# Patient Record
Sex: Female | Born: 1935
Health system: Southern US, Community
[De-identification: ages and names within clinical notes are randomized; demographics above are authoritative.]

## PROBLEM LIST (undated history)

## (undated) DIAGNOSIS — D649 Anemia, unspecified: Secondary | ICD-10-CM

---

## 2004-05-23 ENCOUNTER — Ambulatory Visit: Payer: Self-pay | Admitting: Internal Medicine

## 2005-06-25 ENCOUNTER — Ambulatory Visit: Payer: Self-pay | Admitting: Internal Medicine

## 2006-06-28 ENCOUNTER — Ambulatory Visit: Payer: Self-pay | Admitting: Internal Medicine

## 2006-07-02 ENCOUNTER — Ambulatory Visit: Payer: Self-pay | Admitting: Internal Medicine

## 2007-02-03 ENCOUNTER — Emergency Department: Payer: Self-pay | Admitting: Emergency Medicine

## 2007-06-13 ENCOUNTER — Ambulatory Visit: Payer: Self-pay | Admitting: Internal Medicine

## 2007-06-27 ENCOUNTER — Ambulatory Visit: Payer: Self-pay | Admitting: Internal Medicine

## 2007-07-08 ENCOUNTER — Ambulatory Visit: Payer: Self-pay | Admitting: Internal Medicine

## 2007-07-14 ENCOUNTER — Ambulatory Visit: Payer: Self-pay | Admitting: Internal Medicine

## 2007-10-03 ENCOUNTER — Ambulatory Visit: Payer: Self-pay | Admitting: Internal Medicine

## 2007-10-14 ENCOUNTER — Ambulatory Visit: Payer: Self-pay | Admitting: Internal Medicine

## 2007-11-13 ENCOUNTER — Ambulatory Visit: Payer: Self-pay | Admitting: Internal Medicine

## 2007-11-14 ENCOUNTER — Ambulatory Visit: Payer: Self-pay | Admitting: Internal Medicine

## 2007-12-14 ENCOUNTER — Ambulatory Visit: Payer: Self-pay | Admitting: Internal Medicine

## 2008-01-13 ENCOUNTER — Ambulatory Visit: Payer: Self-pay | Admitting: Internal Medicine

## 2008-01-16 ENCOUNTER — Ambulatory Visit: Payer: Self-pay | Admitting: Internal Medicine

## 2008-02-13 ENCOUNTER — Ambulatory Visit: Payer: Self-pay | Admitting: Internal Medicine

## 2008-07-13 ENCOUNTER — Ambulatory Visit: Payer: Self-pay | Admitting: Internal Medicine

## 2009-07-14 ENCOUNTER — Ambulatory Visit: Payer: Self-pay | Admitting: Internal Medicine

## 2010-12-06 ENCOUNTER — Ambulatory Visit: Payer: Self-pay | Admitting: Internal Medicine

## 2011-08-22 ENCOUNTER — Ambulatory Visit: Payer: Self-pay | Admitting: Physician Assistant

## 2011-09-27 ENCOUNTER — Ambulatory Visit: Payer: Self-pay | Admitting: Specialist

## 2011-11-22 ENCOUNTER — Ambulatory Visit: Payer: Self-pay | Admitting: Specialist

## 2011-11-29 ENCOUNTER — Ambulatory Visit: Payer: Self-pay | Admitting: Cardiothoracic Surgery

## 2011-12-06 ENCOUNTER — Ambulatory Visit: Payer: Self-pay | Admitting: Cardiothoracic Surgery

## 2011-12-11 ENCOUNTER — Ambulatory Visit: Payer: Self-pay | Admitting: Internal Medicine

## 2012-02-13 ENCOUNTER — Ambulatory Visit: Payer: Self-pay | Admitting: Cardiothoracic Surgery

## 2012-02-28 ENCOUNTER — Ambulatory Visit: Payer: Self-pay | Admitting: Cardiothoracic Surgery

## 2013-01-21 ENCOUNTER — Ambulatory Visit: Payer: Self-pay | Admitting: Internal Medicine

## 2013-04-28 ENCOUNTER — Ambulatory Visit: Payer: Self-pay | Admitting: Specialist

## 2014-02-23 DIAGNOSIS — I4891 Unspecified atrial fibrillation: Secondary | ICD-10-CM | POA: Diagnosis not present

## 2014-02-23 DIAGNOSIS — I059 Rheumatic mitral valve disease, unspecified: Secondary | ICD-10-CM | POA: Diagnosis not present

## 2014-02-23 DIAGNOSIS — D510 Vitamin B12 deficiency anemia due to intrinsic factor deficiency: Secondary | ICD-10-CM | POA: Diagnosis not present

## 2014-03-26 DIAGNOSIS — D510 Vitamin B12 deficiency anemia due to intrinsic factor deficiency: Secondary | ICD-10-CM | POA: Diagnosis not present

## 2014-03-26 DIAGNOSIS — I4891 Unspecified atrial fibrillation: Secondary | ICD-10-CM | POA: Diagnosis not present

## 2014-03-26 DIAGNOSIS — E538 Deficiency of other specified B group vitamins: Secondary | ICD-10-CM | POA: Diagnosis not present

## 2014-03-26 DIAGNOSIS — I059 Rheumatic mitral valve disease, unspecified: Secondary | ICD-10-CM | POA: Diagnosis not present

## 2014-04-28 DIAGNOSIS — E538 Deficiency of other specified B group vitamins: Secondary | ICD-10-CM | POA: Diagnosis not present

## 2014-04-28 DIAGNOSIS — D510 Vitamin B12 deficiency anemia due to intrinsic factor deficiency: Secondary | ICD-10-CM | POA: Diagnosis not present

## 2014-05-18 DIAGNOSIS — R05 Cough: Secondary | ICD-10-CM | POA: Diagnosis not present

## 2014-05-18 DIAGNOSIS — J449 Chronic obstructive pulmonary disease, unspecified: Secondary | ICD-10-CM | POA: Diagnosis not present

## 2014-05-18 DIAGNOSIS — R911 Solitary pulmonary nodule: Secondary | ICD-10-CM | POA: Diagnosis not present

## 2014-05-18 DIAGNOSIS — R0602 Shortness of breath: Secondary | ICD-10-CM | POA: Diagnosis not present

## 2014-06-01 DIAGNOSIS — I25719 Atherosclerosis of autologous vein coronary artery bypass graft(s) with unspecified angina pectoris: Secondary | ICD-10-CM | POA: Diagnosis not present

## 2014-06-01 DIAGNOSIS — D510 Vitamin B12 deficiency anemia due to intrinsic factor deficiency: Secondary | ICD-10-CM | POA: Diagnosis not present

## 2014-06-01 DIAGNOSIS — I420 Dilated cardiomyopathy: Secondary | ICD-10-CM | POA: Diagnosis not present

## 2014-06-01 DIAGNOSIS — I480 Paroxysmal atrial fibrillation: Secondary | ICD-10-CM | POA: Diagnosis not present

## 2014-06-01 DIAGNOSIS — E039 Hypothyroidism, unspecified: Secondary | ICD-10-CM | POA: Diagnosis not present

## 2014-06-02 ENCOUNTER — Ambulatory Visit
Admit: 2014-06-02 | Disposition: A | Discharge status: Home / Self Care | Payer: Self-pay | Attending: Internal Medicine | Admitting: Internal Medicine

## 2014-06-02 DIAGNOSIS — D649 Anemia, unspecified: Secondary | ICD-10-CM | POA: Diagnosis not present

## 2014-06-07 DIAGNOSIS — I4891 Unspecified atrial fibrillation: Secondary | ICD-10-CM | POA: Diagnosis not present

## 2014-06-07 DIAGNOSIS — I059 Rheumatic mitral valve disease, unspecified: Secondary | ICD-10-CM | POA: Diagnosis not present

## 2014-06-16 DIAGNOSIS — D649 Anemia, unspecified: Secondary | ICD-10-CM | POA: Diagnosis not present

## 2014-07-06 DIAGNOSIS — I480 Paroxysmal atrial fibrillation: Secondary | ICD-10-CM | POA: Diagnosis not present

## 2014-07-06 DIAGNOSIS — E538 Deficiency of other specified B group vitamins: Secondary | ICD-10-CM | POA: Diagnosis not present

## 2014-07-06 DIAGNOSIS — D510 Vitamin B12 deficiency anemia due to intrinsic factor deficiency: Secondary | ICD-10-CM | POA: Diagnosis not present

## 2014-08-06 DIAGNOSIS — I480 Paroxysmal atrial fibrillation: Secondary | ICD-10-CM | POA: Diagnosis not present

## 2014-08-06 DIAGNOSIS — E538 Deficiency of other specified B group vitamins: Secondary | ICD-10-CM | POA: Diagnosis not present

## 2014-09-01 IMAGING — CT CT CHEST W/O CM
2 of 4 series · 15 of 36 positions shown, 18 images · non-contrast
Comparison: CT CHEST W/O CM dated 02/28/2012 report of a plain film
03/06/2013.

CLINICAL DATA: Left mid lung density seen on chest radiograph.
History of CABG.

EXAM:
CT CHEST WITHOUT CONTRAST
TECHNIQUE: Multidetector CT imaging of the chest was performed following the
standard protocol without IV contrast.

[Series 2: routine chest wo · axial · 0.63mm/px · z∈[-693,-418]mm · 12 of 67 slices shown, 15 images]
[im 6/67  mediastinal]
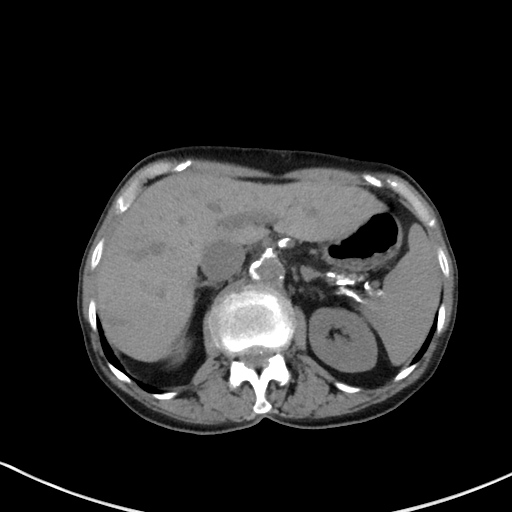
[im 6/67  lung]
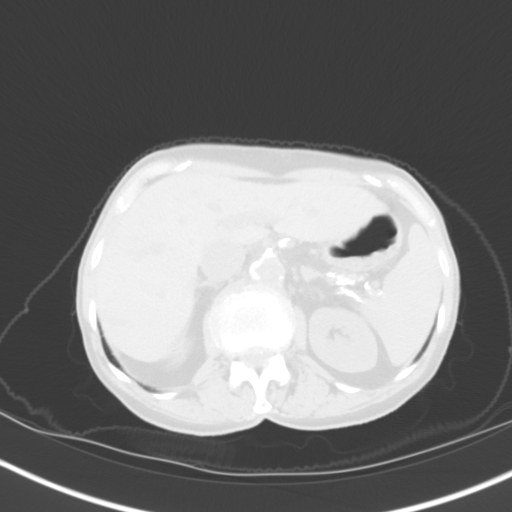
[im 11/67  lung]
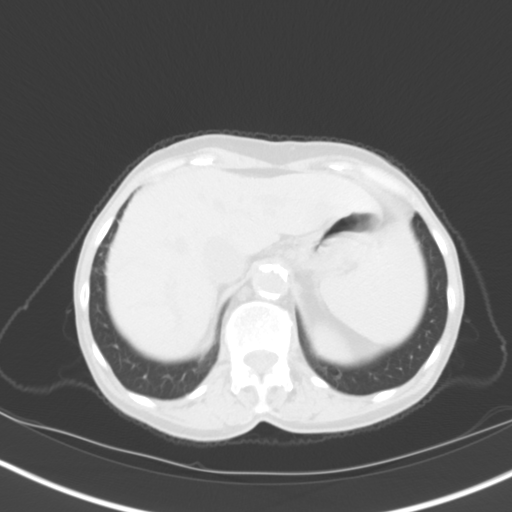
[im 16/67  lung]
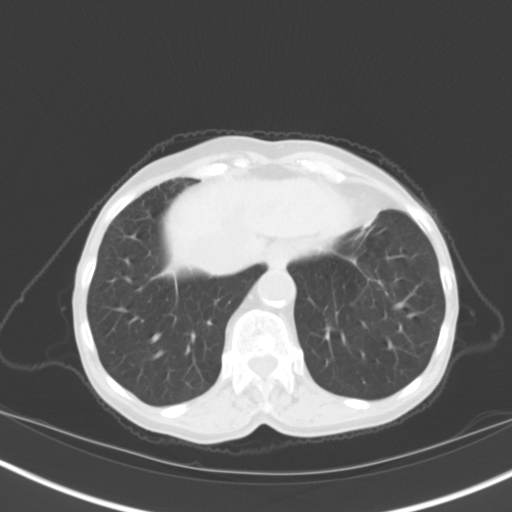
[im 21/67  lung]
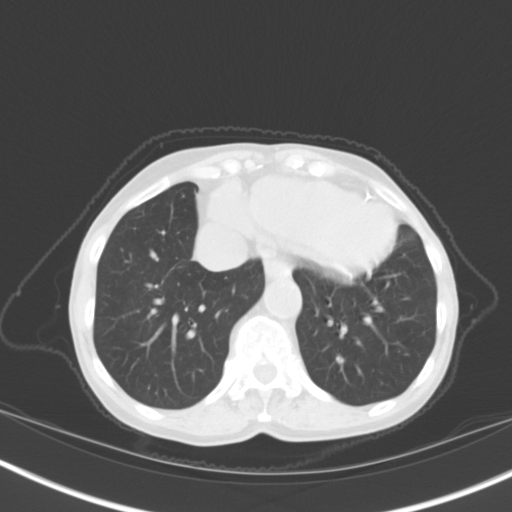
[im 26/67  mediastinal]
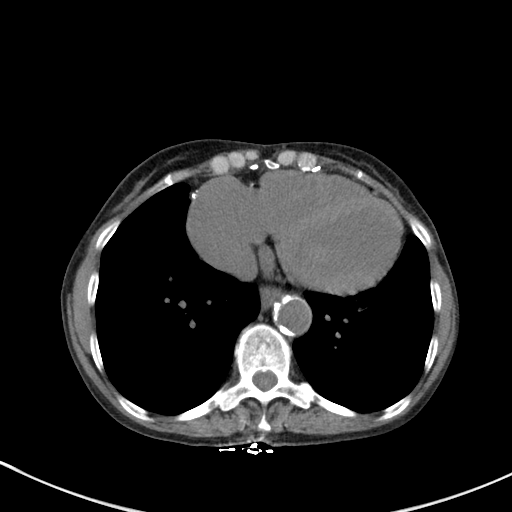
[im 26/67  lung]
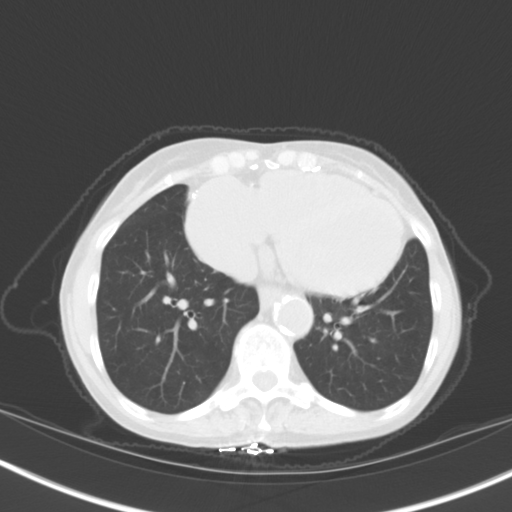
[im 31/67  lung]
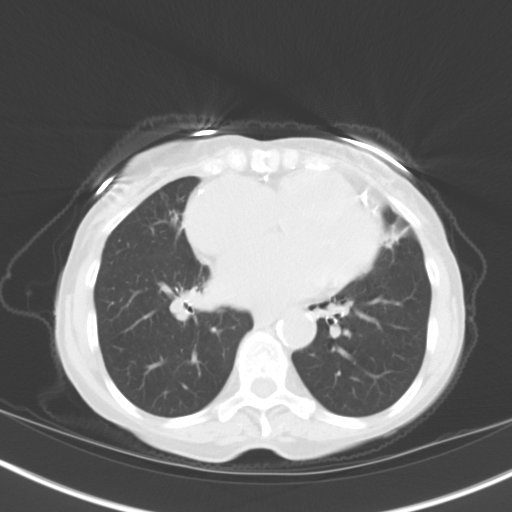
[im 36/67  lung]
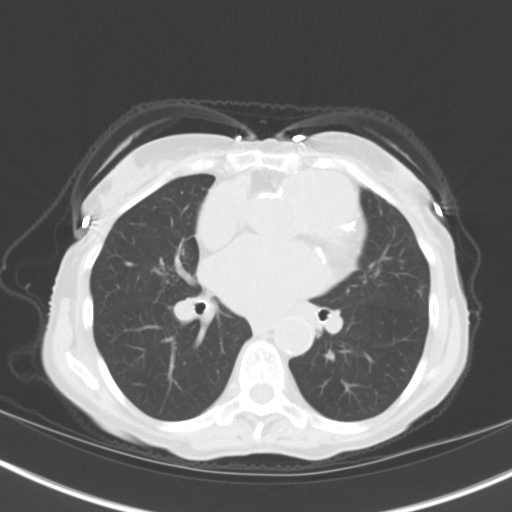
[im 41/67  lung]
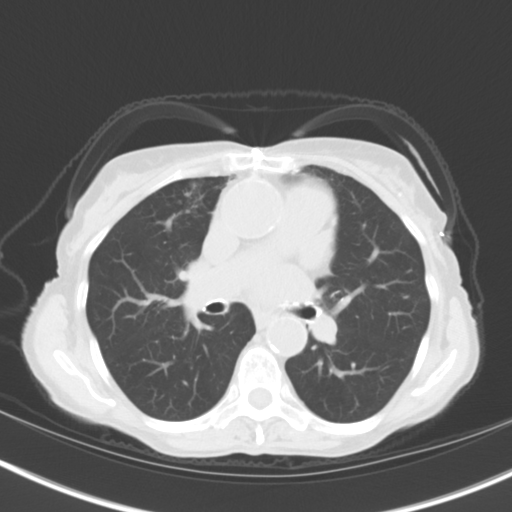
[im 46/67  mediastinal]
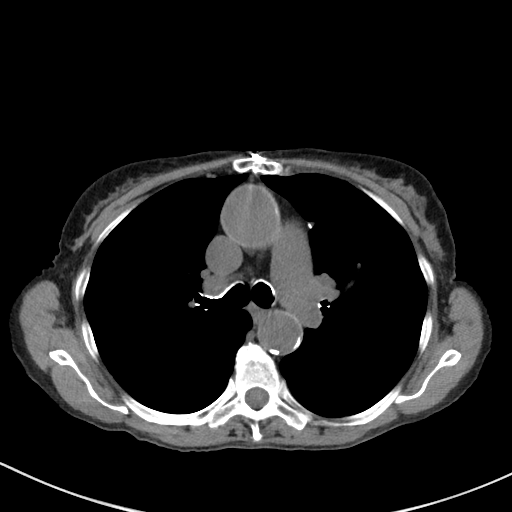
[im 46/67  lung]
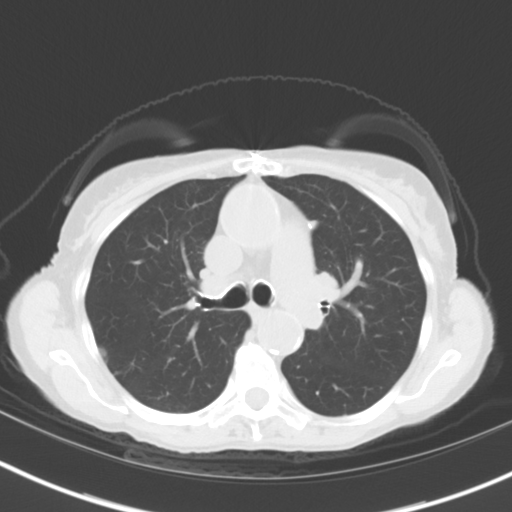
[im 51/67  lung]
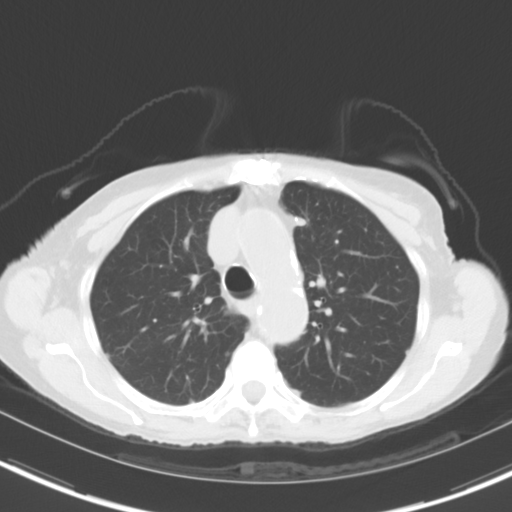
[im 56/67  lung]
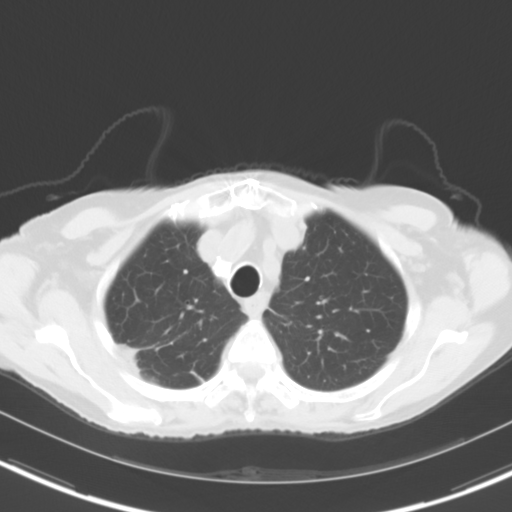
[im 61/67  lung]
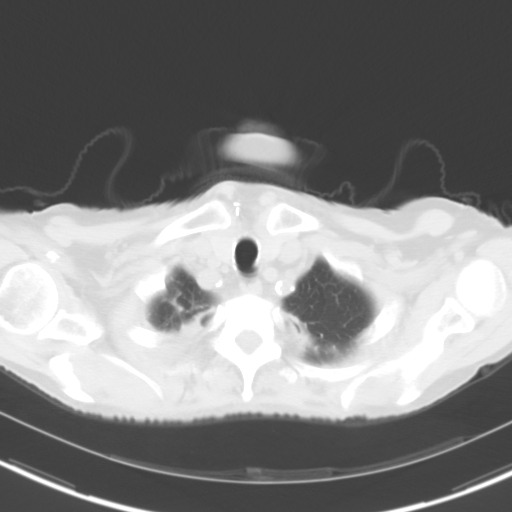

[Series 5: cor routine chest wo · coronal · 0.65mm/px · 3 of 104 slices shown]
[im 21/104  lung]
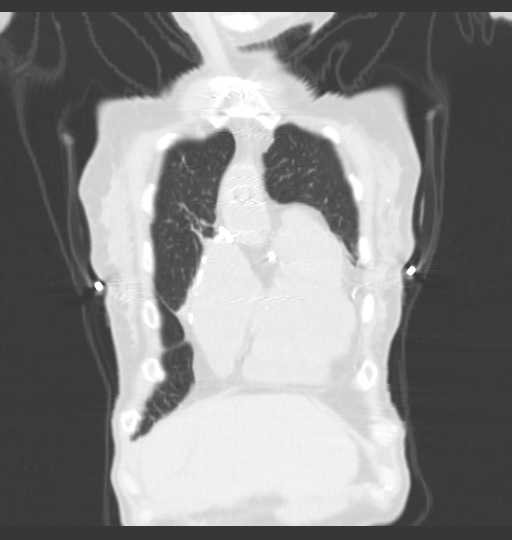
[im 42/104  lung]
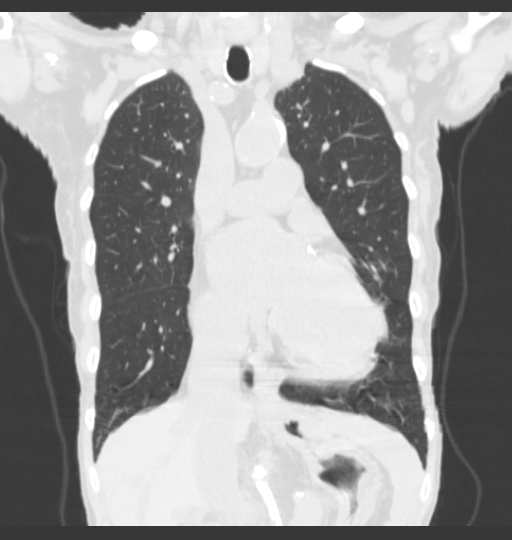
[im 62/104  lung]
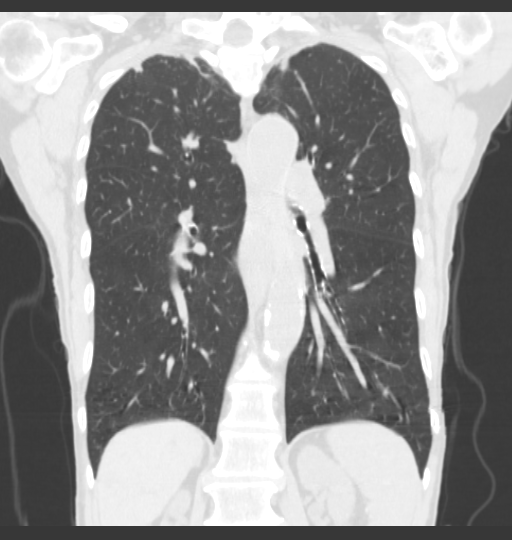

[15 of 36 positions shown; findings below may reference images not displayed]

FINDINGS: Lungs/Pleura:  Biapical pleural-parenchymal scarring at the apices.

Minimal reticular nodular opacity in the anteromedial right upper
lobe on image 20 is unchanged and likely due scarring. Similar
findings more anteriorly, within the superior aspect of the right
middle lobe. This is minimally improved since 02/28/2012. Example
image 27 today.

Subpleural increased density along the right minor fissure on image
22 is unchanged and likely due to scarring.

Scarring and volume loss in the lingula. This could have accounted
for the plain film abnormality. Example image 37. Similar. Minimal
motion degradation inferiorly. No suspicious left-sided pulmonary
nodule or mass

No pleural fluid.

Heart/Mediastinum: A left axillary node which measures 1.0 cm on
image 14 and is similar, suggesting reactive etiology. Median
sternotomy with advanced aortic atherosclerosis. Moderate
cardiomegaly with moderate bilateral atrial enlargement. No
pericardial effusion. No mediastinal or definite hilar adenopathy,
given limitations of unenhanced CT.

Relative hypoattenuation of the intravascular space, possibly
representing anemia. Example image 42.

Upper Abdomen: No significant findings. There is dense aortic and
branch vessel atherosclerosis.

Bones/Musculoskeletal:  No acute osseous abnormality.
IMPRESSION: 1. No suspicious pulmonary lesion, including within the left lung.
Scattered areas of scarring, including lingular scarring and volume
loss. This may have accounted for the plain film abnormality. Only
the prior report is available.
2. Cardiomegaly with prior median sternotomy.
3. Hypoattenuation in the intravascular space, possibly representing
anemia.

## 2014-09-07 DIAGNOSIS — E538 Deficiency of other specified B group vitamins: Secondary | ICD-10-CM | POA: Diagnosis not present

## 2014-10-05 DIAGNOSIS — I480 Paroxysmal atrial fibrillation: Secondary | ICD-10-CM | POA: Diagnosis not present

## 2014-10-05 DIAGNOSIS — Z0000 Encounter for general adult medical examination without abnormal findings: Secondary | ICD-10-CM | POA: Diagnosis not present

## 2014-10-05 DIAGNOSIS — I25719 Atherosclerosis of autologous vein coronary artery bypass graft(s) with unspecified angina pectoris: Secondary | ICD-10-CM | POA: Diagnosis not present

## 2014-10-05 DIAGNOSIS — D509 Iron deficiency anemia, unspecified: Secondary | ICD-10-CM | POA: Diagnosis not present

## 2014-10-05 DIAGNOSIS — J42 Unspecified chronic bronchitis: Secondary | ICD-10-CM | POA: Diagnosis not present

## 2014-10-05 DIAGNOSIS — E538 Deficiency of other specified B group vitamins: Secondary | ICD-10-CM | POA: Diagnosis not present

## 2014-10-05 DIAGNOSIS — I420 Dilated cardiomyopathy: Secondary | ICD-10-CM | POA: Diagnosis not present

## 2014-10-05 DIAGNOSIS — E039 Hypothyroidism, unspecified: Secondary | ICD-10-CM | POA: Diagnosis not present

## 2014-10-08 DIAGNOSIS — E538 Deficiency of other specified B group vitamins: Secondary | ICD-10-CM | POA: Diagnosis not present

## 2014-11-15 DIAGNOSIS — E538 Deficiency of other specified B group vitamins: Secondary | ICD-10-CM | POA: Diagnosis not present

## 2014-11-15 DIAGNOSIS — I480 Paroxysmal atrial fibrillation: Secondary | ICD-10-CM | POA: Diagnosis not present

## 2014-11-29 DIAGNOSIS — I480 Paroxysmal atrial fibrillation: Secondary | ICD-10-CM | POA: Diagnosis not present

## 2014-12-21 DIAGNOSIS — Z23 Encounter for immunization: Secondary | ICD-10-CM | POA: Diagnosis not present

## 2014-12-21 DIAGNOSIS — D510 Vitamin B12 deficiency anemia due to intrinsic factor deficiency: Secondary | ICD-10-CM | POA: Diagnosis not present

## 2014-12-30 DIAGNOSIS — I480 Paroxysmal atrial fibrillation: Secondary | ICD-10-CM | POA: Diagnosis not present

## 2015-01-14 DIAGNOSIS — M47816 Spondylosis without myelopathy or radiculopathy, lumbar region: Secondary | ICD-10-CM | POA: Diagnosis not present

## 2015-01-14 DIAGNOSIS — K5900 Constipation, unspecified: Secondary | ICD-10-CM | POA: Diagnosis not present

## 2015-01-14 DIAGNOSIS — M5442 Lumbago with sciatica, left side: Secondary | ICD-10-CM | POA: Diagnosis not present

## 2015-01-21 DIAGNOSIS — E538 Deficiency of other specified B group vitamins: Secondary | ICD-10-CM | POA: Diagnosis not present

## 2015-01-27 DIAGNOSIS — I480 Paroxysmal atrial fibrillation: Secondary | ICD-10-CM | POA: Diagnosis not present

## 2015-02-17 DIAGNOSIS — I480 Paroxysmal atrial fibrillation: Secondary | ICD-10-CM | POA: Diagnosis not present

## 2015-03-29 DIAGNOSIS — I480 Paroxysmal atrial fibrillation: Secondary | ICD-10-CM | POA: Diagnosis not present

## 2015-03-29 DIAGNOSIS — E538 Deficiency of other specified B group vitamins: Secondary | ICD-10-CM | POA: Diagnosis not present

## 2015-04-22 DIAGNOSIS — R0602 Shortness of breath: Secondary | ICD-10-CM | POA: Diagnosis not present

## 2015-04-22 DIAGNOSIS — J42 Unspecified chronic bronchitis: Secondary | ICD-10-CM | POA: Diagnosis not present

## 2015-04-22 DIAGNOSIS — R911 Solitary pulmonary nodule: Secondary | ICD-10-CM | POA: Diagnosis not present

## 2015-04-29 DIAGNOSIS — E538 Deficiency of other specified B group vitamins: Secondary | ICD-10-CM | POA: Diagnosis not present

## 2015-04-29 DIAGNOSIS — I480 Paroxysmal atrial fibrillation: Secondary | ICD-10-CM | POA: Diagnosis not present

## 2015-05-31 DIAGNOSIS — E538 Deficiency of other specified B group vitamins: Secondary | ICD-10-CM | POA: Diagnosis not present

## 2015-06-02 DIAGNOSIS — I480 Paroxysmal atrial fibrillation: Secondary | ICD-10-CM | POA: Diagnosis not present

## 2015-07-01 DIAGNOSIS — I480 Paroxysmal atrial fibrillation: Secondary | ICD-10-CM | POA: Diagnosis not present

## 2015-07-01 DIAGNOSIS — E538 Deficiency of other specified B group vitamins: Secondary | ICD-10-CM | POA: Diagnosis not present

## 2015-08-02 DIAGNOSIS — E538 Deficiency of other specified B group vitamins: Secondary | ICD-10-CM | POA: Diagnosis not present

## 2015-08-02 DIAGNOSIS — I480 Paroxysmal atrial fibrillation: Secondary | ICD-10-CM | POA: Diagnosis not present

## 2015-09-01 DIAGNOSIS — E538 Deficiency of other specified B group vitamins: Secondary | ICD-10-CM | POA: Diagnosis not present

## 2015-09-01 DIAGNOSIS — I480 Paroxysmal atrial fibrillation: Secondary | ICD-10-CM | POA: Diagnosis not present

## 2015-10-04 DIAGNOSIS — E538 Deficiency of other specified B group vitamins: Secondary | ICD-10-CM | POA: Diagnosis not present

## 2015-10-04 DIAGNOSIS — I480 Paroxysmal atrial fibrillation: Secondary | ICD-10-CM | POA: Diagnosis not present

## 2015-10-26 DIAGNOSIS — I4891 Unspecified atrial fibrillation: Secondary | ICD-10-CM | POA: Diagnosis not present

## 2015-10-26 DIAGNOSIS — I420 Dilated cardiomyopathy: Secondary | ICD-10-CM | POA: Diagnosis not present

## 2015-10-26 DIAGNOSIS — E039 Hypothyroidism, unspecified: Secondary | ICD-10-CM | POA: Diagnosis not present

## 2015-10-26 DIAGNOSIS — J42 Unspecified chronic bronchitis: Secondary | ICD-10-CM | POA: Diagnosis not present

## 2015-10-26 DIAGNOSIS — Z23 Encounter for immunization: Secondary | ICD-10-CM | POA: Diagnosis not present

## 2015-10-26 DIAGNOSIS — R634 Abnormal weight loss: Secondary | ICD-10-CM | POA: Diagnosis not present

## 2015-10-26 DIAGNOSIS — I25719 Atherosclerosis of autologous vein coronary artery bypass graft(s) with unspecified angina pectoris: Secondary | ICD-10-CM | POA: Diagnosis not present

## 2015-10-26 DIAGNOSIS — E538 Deficiency of other specified B group vitamins: Secondary | ICD-10-CM | POA: Diagnosis not present

## 2015-11-04 DIAGNOSIS — I4891 Unspecified atrial fibrillation: Secondary | ICD-10-CM | POA: Diagnosis not present

## 2015-11-04 DIAGNOSIS — E538 Deficiency of other specified B group vitamins: Secondary | ICD-10-CM | POA: Diagnosis not present

## 2015-11-04 DIAGNOSIS — E039 Hypothyroidism, unspecified: Secondary | ICD-10-CM | POA: Diagnosis not present

## 2015-11-04 DIAGNOSIS — I480 Paroxysmal atrial fibrillation: Secondary | ICD-10-CM | POA: Diagnosis not present

## 2015-11-04 DIAGNOSIS — D510 Vitamin B12 deficiency anemia due to intrinsic factor deficiency: Secondary | ICD-10-CM | POA: Diagnosis not present

## 2015-11-04 DIAGNOSIS — I25719 Atherosclerosis of autologous vein coronary artery bypass graft(s) with unspecified angina pectoris: Secondary | ICD-10-CM | POA: Diagnosis not present

## 2015-11-22 DIAGNOSIS — I420 Dilated cardiomyopathy: Secondary | ICD-10-CM | POA: Diagnosis not present

## 2015-11-22 DIAGNOSIS — R0609 Other forms of dyspnea: Secondary | ICD-10-CM | POA: Diagnosis not present

## 2015-12-13 DIAGNOSIS — I4891 Unspecified atrial fibrillation: Secondary | ICD-10-CM | POA: Diagnosis not present

## 2015-12-13 DIAGNOSIS — I059 Rheumatic mitral valve disease, unspecified: Secondary | ICD-10-CM | POA: Diagnosis not present

## 2015-12-13 DIAGNOSIS — E538 Deficiency of other specified B group vitamins: Secondary | ICD-10-CM | POA: Diagnosis not present

## 2016-01-17 DIAGNOSIS — E538 Deficiency of other specified B group vitamins: Secondary | ICD-10-CM | POA: Diagnosis not present

## 2016-01-17 DIAGNOSIS — I4891 Unspecified atrial fibrillation: Secondary | ICD-10-CM | POA: Diagnosis not present

## 2016-01-17 DIAGNOSIS — I059 Rheumatic mitral valve disease, unspecified: Secondary | ICD-10-CM | POA: Diagnosis not present

## 2019-02-16 DIAGNOSIS — E039 Hypothyroidism, unspecified: Secondary | ICD-10-CM | POA: Diagnosis not present

## 2019-02-16 DIAGNOSIS — R791 Abnormal coagulation profile: Secondary | ICD-10-CM | POA: Diagnosis not present

## 2019-02-16 DIAGNOSIS — E538 Deficiency of other specified B group vitamins: Secondary | ICD-10-CM | POA: Diagnosis not present

## 2019-03-04 DIAGNOSIS — R791 Abnormal coagulation profile: Secondary | ICD-10-CM | POA: Diagnosis not present

## 2019-03-19 DIAGNOSIS — I4891 Unspecified atrial fibrillation: Secondary | ICD-10-CM | POA: Diagnosis not present

## 2019-03-19 DIAGNOSIS — R791 Abnormal coagulation profile: Secondary | ICD-10-CM | POA: Diagnosis not present

## 2019-03-19 DIAGNOSIS — E538 Deficiency of other specified B group vitamins: Secondary | ICD-10-CM | POA: Diagnosis not present

## 2019-03-30 DIAGNOSIS — R791 Abnormal coagulation profile: Secondary | ICD-10-CM | POA: Diagnosis not present

## 2019-04-13 DIAGNOSIS — R791 Abnormal coagulation profile: Secondary | ICD-10-CM | POA: Diagnosis not present

## 2019-04-20 DIAGNOSIS — E538 Deficiency of other specified B group vitamins: Secondary | ICD-10-CM | POA: Diagnosis not present

## 2019-04-27 DIAGNOSIS — R791 Abnormal coagulation profile: Secondary | ICD-10-CM | POA: Diagnosis not present

## 2019-04-27 DIAGNOSIS — E785 Hyperlipidemia, unspecified: Secondary | ICD-10-CM | POA: Diagnosis not present

## 2019-04-27 DIAGNOSIS — I10 Essential (primary) hypertension: Secondary | ICD-10-CM | POA: Diagnosis not present

## 2019-05-21 DIAGNOSIS — R791 Abnormal coagulation profile: Secondary | ICD-10-CM | POA: Diagnosis not present

## 2019-05-21 DIAGNOSIS — E538 Deficiency of other specified B group vitamins: Secondary | ICD-10-CM | POA: Diagnosis not present

## 2019-06-22 DIAGNOSIS — E538 Deficiency of other specified B group vitamins: Secondary | ICD-10-CM | POA: Diagnosis not present

## 2019-06-22 DIAGNOSIS — I482 Chronic atrial fibrillation, unspecified: Secondary | ICD-10-CM | POA: Diagnosis not present

## 2019-07-23 DIAGNOSIS — I482 Chronic atrial fibrillation, unspecified: Secondary | ICD-10-CM | POA: Diagnosis not present

## 2019-07-23 DIAGNOSIS — E538 Deficiency of other specified B group vitamins: Secondary | ICD-10-CM | POA: Diagnosis not present

## 2019-08-24 DIAGNOSIS — E538 Deficiency of other specified B group vitamins: Secondary | ICD-10-CM | POA: Diagnosis not present

## 2019-08-24 DIAGNOSIS — I482 Chronic atrial fibrillation, unspecified: Secondary | ICD-10-CM | POA: Diagnosis not present

## 2019-09-24 DIAGNOSIS — E538 Deficiency of other specified B group vitamins: Secondary | ICD-10-CM | POA: Diagnosis not present

## 2019-09-24 DIAGNOSIS — I482 Chronic atrial fibrillation, unspecified: Secondary | ICD-10-CM | POA: Diagnosis not present

## 2019-09-25 DIAGNOSIS — R791 Abnormal coagulation profile: Secondary | ICD-10-CM | POA: Diagnosis not present

## 2019-09-28 DIAGNOSIS — R791 Abnormal coagulation profile: Secondary | ICD-10-CM | POA: Diagnosis not present

## 2019-09-30 DIAGNOSIS — R791 Abnormal coagulation profile: Secondary | ICD-10-CM | POA: Diagnosis not present

## 2019-10-05 DIAGNOSIS — I482 Chronic atrial fibrillation, unspecified: Secondary | ICD-10-CM | POA: Diagnosis not present

## 2019-10-09 DIAGNOSIS — I482 Chronic atrial fibrillation, unspecified: Secondary | ICD-10-CM | POA: Diagnosis not present

## 2019-10-26 DIAGNOSIS — I482 Chronic atrial fibrillation, unspecified: Secondary | ICD-10-CM | POA: Diagnosis not present

## 2019-10-26 DIAGNOSIS — Z23 Encounter for immunization: Secondary | ICD-10-CM | POA: Diagnosis not present

## 2019-10-26 DIAGNOSIS — E538 Deficiency of other specified B group vitamins: Secondary | ICD-10-CM | POA: Diagnosis not present

## 2019-11-26 DIAGNOSIS — I482 Chronic atrial fibrillation, unspecified: Secondary | ICD-10-CM | POA: Diagnosis not present

## 2019-11-26 DIAGNOSIS — E538 Deficiency of other specified B group vitamins: Secondary | ICD-10-CM | POA: Diagnosis not present

## 2019-12-28 DIAGNOSIS — E538 Deficiency of other specified B group vitamins: Secondary | ICD-10-CM | POA: Diagnosis not present

## 2019-12-28 DIAGNOSIS — I482 Chronic atrial fibrillation, unspecified: Secondary | ICD-10-CM | POA: Diagnosis not present

## 2020-01-28 DIAGNOSIS — I482 Chronic atrial fibrillation, unspecified: Secondary | ICD-10-CM | POA: Diagnosis not present

## 2020-01-28 DIAGNOSIS — E538 Deficiency of other specified B group vitamins: Secondary | ICD-10-CM | POA: Diagnosis not present

## 2020-02-04 DIAGNOSIS — I482 Chronic atrial fibrillation, unspecified: Secondary | ICD-10-CM | POA: Diagnosis not present

## 2020-03-03 DIAGNOSIS — E538 Deficiency of other specified B group vitamins: Secondary | ICD-10-CM | POA: Diagnosis not present

## 2020-03-03 DIAGNOSIS — I482 Chronic atrial fibrillation, unspecified: Secondary | ICD-10-CM | POA: Diagnosis not present

## 2020-04-04 DIAGNOSIS — Z7901 Long term (current) use of anticoagulants: Secondary | ICD-10-CM | POA: Diagnosis not present

## 2020-04-04 DIAGNOSIS — I4891 Unspecified atrial fibrillation: Secondary | ICD-10-CM | POA: Diagnosis not present

## 2020-04-04 DIAGNOSIS — E538 Deficiency of other specified B group vitamins: Secondary | ICD-10-CM | POA: Diagnosis not present

## 2020-04-22 DIAGNOSIS — J449 Chronic obstructive pulmonary disease, unspecified: Secondary | ICD-10-CM | POA: Diagnosis not present

## 2020-04-22 DIAGNOSIS — I4891 Unspecified atrial fibrillation: Secondary | ICD-10-CM | POA: Diagnosis not present

## 2020-04-22 DIAGNOSIS — E039 Hypothyroidism, unspecified: Secondary | ICD-10-CM | POA: Diagnosis not present

## 2020-04-22 DIAGNOSIS — Z0000 Encounter for general adult medical examination without abnormal findings: Secondary | ICD-10-CM | POA: Diagnosis not present

## 2020-04-22 DIAGNOSIS — I420 Dilated cardiomyopathy: Secondary | ICD-10-CM | POA: Diagnosis not present

## 2020-04-22 DIAGNOSIS — I25719 Atherosclerosis of autologous vein coronary artery bypass graft(s) with unspecified angina pectoris: Secondary | ICD-10-CM | POA: Diagnosis not present

## 2020-05-05 DIAGNOSIS — E039 Hypothyroidism, unspecified: Secondary | ICD-10-CM | POA: Diagnosis not present

## 2020-05-05 DIAGNOSIS — I4891 Unspecified atrial fibrillation: Secondary | ICD-10-CM | POA: Diagnosis not present

## 2020-05-05 DIAGNOSIS — Z7901 Long term (current) use of anticoagulants: Secondary | ICD-10-CM | POA: Diagnosis not present

## 2020-05-05 DIAGNOSIS — E538 Deficiency of other specified B group vitamins: Secondary | ICD-10-CM | POA: Diagnosis not present

## 2020-05-05 DIAGNOSIS — I25719 Atherosclerosis of autologous vein coronary artery bypass graft(s) with unspecified angina pectoris: Secondary | ICD-10-CM | POA: Diagnosis not present

## 2020-06-06 DIAGNOSIS — Z7901 Long term (current) use of anticoagulants: Secondary | ICD-10-CM | POA: Diagnosis not present

## 2020-06-06 DIAGNOSIS — E538 Deficiency of other specified B group vitamins: Secondary | ICD-10-CM | POA: Diagnosis not present

## 2020-06-06 DIAGNOSIS — D509 Iron deficiency anemia, unspecified: Secondary | ICD-10-CM | POA: Diagnosis not present

## 2020-06-06 DIAGNOSIS — I4891 Unspecified atrial fibrillation: Secondary | ICD-10-CM | POA: Diagnosis not present

## 2020-06-23 ENCOUNTER — Other Ambulatory Visit
Admission: RE | Admit: 2020-06-23 | Discharge: 2020-06-23 | Disposition: A | Discharge status: Home / Self Care | Payer: Medicare HMO | Source: Ambulatory Visit | Attending: Internal Medicine | Admitting: Internal Medicine

## 2020-06-23 DIAGNOSIS — Z03818 Encounter for observation for suspected exposure to other biological agents ruled out: Secondary | ICD-10-CM | POA: Diagnosis not present

## 2020-06-23 DIAGNOSIS — I4891 Unspecified atrial fibrillation: Secondary | ICD-10-CM | POA: Insufficient documentation

## 2020-06-23 DIAGNOSIS — R5383 Other fatigue: Secondary | ICD-10-CM | POA: Diagnosis not present

## 2020-06-23 DIAGNOSIS — J42 Unspecified chronic bronchitis: Secondary | ICD-10-CM | POA: Diagnosis not present

## 2020-06-23 DIAGNOSIS — R0602 Shortness of breath: Secondary | ICD-10-CM | POA: Diagnosis not present

## 2020-06-23 LAB — D-DIMER, QUANTITATIVE: D-Dimer, Quant: 0.3 ug/mL-FEU (ref 0.00–0.50)

## 2020-06-23 LAB — TROPONIN I (HIGH SENSITIVITY): Troponin I (High Sensitivity): 13 ng/L (ref <18)

## 2020-07-07 DIAGNOSIS — D509 Iron deficiency anemia, unspecified: Secondary | ICD-10-CM | POA: Diagnosis not present

## 2020-07-07 DIAGNOSIS — R8290 Unspecified abnormal findings in urine: Secondary | ICD-10-CM | POA: Diagnosis not present

## 2020-07-07 DIAGNOSIS — Z7901 Long term (current) use of anticoagulants: Secondary | ICD-10-CM | POA: Diagnosis not present

## 2020-07-07 DIAGNOSIS — I4891 Unspecified atrial fibrillation: Secondary | ICD-10-CM | POA: Diagnosis not present

## 2020-07-07 DIAGNOSIS — R399 Unspecified symptoms and signs involving the genitourinary system: Secondary | ICD-10-CM | POA: Diagnosis not present

## 2020-07-07 DIAGNOSIS — E538 Deficiency of other specified B group vitamins: Secondary | ICD-10-CM | POA: Diagnosis not present

## 2020-07-22 DIAGNOSIS — D649 Anemia, unspecified: Secondary | ICD-10-CM | POA: Insufficient documentation

## 2020-07-22 NOTE — Progress Notes (Signed)
[facility]  Telephone:([phone] Fax:(336) [phone]  ID: MESHIA RAU OB: Aug 12, 1930  MR#: [id number]  OYD#:[id number]  Patient Care Team: Kirk Ruths, MD as PCP - General (Internal Medicine)  CHIEF COMPLAINT: Anemia, unspecified  INTERVAL HISTORY: Patient is an 87 year old female who was noted to have declining hemoglobin on routine blood work.  She has chronic weakness and fatigue, but otherwise feels well.  She remains active.  She has no neurologic complaints.  She has a good appetite and denies weight loss.  She denies any recent fevers or illnesses.  She has no chest pain, shortness of breath, cough, or hemoptysis.  She denies any nausea, vomiting, constipation, or diarrhea.  She has no melena or hematochezia.  She has no urinary complaints.  Patient feels at her baseline offers no specific complaints today.  REVIEW OF SYSTEMS:   Review of Systems  Constitutional:  Positive for malaise/fatigue. Negative for fever and weight loss.  Respiratory: Negative.  Negative for cough, hemoptysis and shortness of breath.   Cardiovascular: Negative.  Negative for chest pain and leg swelling.  Gastrointestinal: Negative.  Negative for abdominal pain, blood in stool and melena.  Genitourinary: Negative.  Negative for hematuria.  Musculoskeletal: Negative.  Negative for back pain.  Skin: Negative.  Negative for rash.  Neurological: Negative.  Negative for dizziness, focal weakness, weakness and headaches.  Psychiatric/Behavioral: Negative.  The patient is not nervous/anxious.    As per HPI. Otherwise, a complete review of systems is negative.  PAST MEDICAL HISTORY: No past medical history on file.  PAST SURGICAL HISTORY: No reported surgical history.  FAMILY HISTORY: No family history on file.  ADVANCED DIRECTIVES (Y/N):  N  HEALTH MAINTENANCE:     Colonoscopy:  PAP:  Bone density:  Lipid panel:  No Known Allergies  Current Outpatient  Medications  Medication Sig Dispense Refill   Calcium Carbonate-Vitamin D 600-200 MG-UNIT TABS Take 1 tablet by mouth daily.     cyanocobalamin (,VITAMIN B-12,) 1000 MCG/ML injection Inject into the muscle.     furosemide (LASIX) 20 MG tablet Take by mouth.     isosorbide mononitrate (IMDUR) 30 MG 24 hr tablet Take 1 tablet by mouth one time daily     levothyroxine (SYNTHROID) 100 MCG tablet Take by mouth.     metoprolol tartrate (LOPRESSOR) 100 MG tablet Take 1 tablet by mouth 2 (two) times daily.     mirtazapine (REMERON) 7.5 MG tablet Take by mouth.     potassium chloride (KLOR-CON) 8 MEQ tablet TAKE 1 TABLET BY MOUTH ONCE A DAY AS DIRECTED     simvastatin (ZOCOR) 40 MG tablet Take by mouth.     spironolactone (ALDACTONE) 25 MG tablet Take 1 tablet by mouth daily.     traZODone (DESYREL) 50 MG tablet Take by mouth.     No current facility-administered medications for this visit.    OBJECTIVE: Vitals:   07/26/20 1545  BP: (!) 151/80  Pulse: 69  Resp: 16  Temp: 97.6 F (36.4 C)  SpO2: 98%     Body mass index is 18.24 kg/m.    ECOG FS:0 - Asymptomatic  General: Well-developed, well-nourished, no acute distress. Eyes: Pink conjunctiva, anicteric sclera. HEENT: Normocephalic, moist mucous membranes. Lungs: No audible wheezing or coughing. Heart: Regular rate and rhythm. Abdomen: Soft, nontender, no obvious distention. Musculoskeletal: No edema, cyanosis, or clubbing. Neuro: Alert, answering all questions appropriately. Cranial nerves grossly intact. Skin: No rashes or petechiae noted. Psych: Normal affect. Lymphatics: No cervical,  calvicular, axillary or inguinal LAD.   LAB RESULTS:  No results found for: NA, K, CL, CO2, GLUCOSE, BUN, CREATININE, CALCIUM, PROT, ALBUMIN, AST, ALT, ALKPHOS, BILITOT, GFRNONAA, GFRAA  Lab Results  Component Value Date   WBC 6.6 07/26/2020   HGB 9.4 (L) 07/26/2020   HCT 31.1 (L) 07/26/2020   MCV 89.4 07/26/2020   PLT 252 07/26/2020    Lab Results  Component Value Date   IRON 29 07/26/2020   TIBC 458 (H) 07/26/2020   IRONPCTSAT 6 (L) 07/26/2020   Lab Results  Component Value Date   FERRITIN 32 07/26/2020     STUDIES: No results found.  ASSESSMENT:  Anemia, unspecified.  PLAN:    Anemia, unspecified: Patient's hemoglobin and iron stores are decreased and she was noted to have an inappropriately normal reticulocyte count and erythropoietin level.  All of her other laboratory work including B12, folate, SPEP, and hemolysis labs are either negative or within normal limits.   IntelliGEN Myeloid panel was drawn for completeness and is pending at time of dictation.  Patient has been instructed to continue her oral iron supplementation.  Return to clinic in 2 weeks for a further evaluation, discussion of her laboratory results, and consideration of IV Venofer if desired. Hypertension: Patient's blood pressure is moderately elevated today.  Continue evaluation and treatment per primary care.  I spent a total of 45 minutes reviewing chart data, face-to-face evaluation with the patient, counseling and coordination of care as detailed above.  Patient expressed understanding and was in agreement with this plan. She also understands that She can call clinic at any time with any questions, concerns, or complaints.    Lloyd Huger, MD   07/27/2020 10:01 PM

## 2020-07-26 ENCOUNTER — Inpatient Hospital Stay: Payer: Medicare HMO | Attending: Oncology | Admitting: Oncology

## 2020-07-26 ENCOUNTER — Other Ambulatory Visit: Payer: Self-pay

## 2020-07-26 ENCOUNTER — Inpatient Hospital Stay: Payer: Medicare HMO

## 2020-07-26 DIAGNOSIS — D649 Anemia, unspecified: Secondary | ICD-10-CM | POA: Diagnosis not present

## 2020-07-26 DIAGNOSIS — D509 Iron deficiency anemia, unspecified: Secondary | ICD-10-CM | POA: Diagnosis not present

## 2020-07-26 DIAGNOSIS — I10 Essential (primary) hypertension: Secondary | ICD-10-CM

## 2020-07-26 LAB — FOLATE: Folate: 12.5 ng/mL (ref >5.9)

## 2020-07-26 LAB — VITAMIN B12: Vitamin B-12: 676 pg/mL (ref 180–914)

## 2020-07-26 LAB — IRON AND TIBC
Iron: 29 ug/dL (ref 28–170)
Saturation Ratios: 6 % — ABNORMAL LOW (ref 10.4–31.8)
TIBC: 458 ug/dL — ABNORMAL HIGH (ref 250–450)
UIBC: 429 ug/dL

## 2020-07-26 LAB — RETICULOCYTES
Immature Retic Fract: 6.9 % (ref 2.3–15.9)
RBC.: 3.49 MIL/uL — ABNORMAL LOW (ref 3.87–5.11)
Retic Count, Absolute: 70.1 10*3/uL (ref 19.0–186.0)
Retic Ct Pct: 2 % (ref 0.4–3.1)

## 2020-07-26 LAB — CBC
HCT: 31.1 % — ABNORMAL LOW (ref 36.0–46.0)
Hemoglobin: 9.4 g/dL — ABNORMAL LOW (ref 12.0–15.0)
MCH: 27 pg (ref 26.0–34.0)
MCHC: 30.2 g/dL (ref 30.0–36.0)
MCV: 89.4 fL (ref 80.0–100.0)
Platelets: 252 10*3/uL (ref 150–400)
RBC: 3.48 MIL/uL — ABNORMAL LOW (ref 3.87–5.11)
RDW: 21.2 % — ABNORMAL HIGH (ref 11.5–15.5)
WBC: 6.6 10*3/uL (ref 4.0–10.5)
nRBC: 0 % (ref 0.0–0.2)

## 2020-07-26 LAB — DAT, POLYSPECIFIC AHG (ARMC ONLY): Polyspecific AHG test: NEGATIVE

## 2020-07-26 LAB — LACTATE DEHYDROGENASE: LDH: 157 U/L (ref 98–192)

## 2020-07-26 LAB — FERRITIN: Ferritin: 32 ng/mL (ref 11–307)

## 2020-07-26 NOTE — Progress Notes (Signed)
New pt referred by Dr Ouida Sills for anemia  Pt reports being very weak and tired.  Falls often.

## 2020-07-27 ENCOUNTER — Encounter: Payer: Self-pay | Admitting: Oncology

## 2020-07-27 DIAGNOSIS — D509 Iron deficiency anemia, unspecified: Secondary | ICD-10-CM | POA: Insufficient documentation

## 2020-07-27 LAB — PROTEIN ELECTROPHORESIS, SERUM
A/G Ratio: 1.4 (ref 0.7–1.7)
Albumin ELP: 4.3 g/dL (ref 2.9–4.4)
Alpha-1-Globulin: 0.2 g/dL (ref 0.0–0.4)
Alpha-2-Globulin: 0.7 g/dL (ref 0.4–1.0)
Beta Globulin: 1 g/dL (ref 0.7–1.3)
Gamma Globulin: 1 g/dL (ref 0.4–1.8)
Globulin, Total: 3 g/dL (ref 2.2–3.9)
Total Protein ELP: 7.3 g/dL (ref 6.0–8.5)

## 2020-07-27 LAB — HAPTOGLOBIN: Haptoglobin: 156 mg/dL (ref 41–333)

## 2020-07-27 LAB — ERYTHROPOIETIN: Erythropoietin: 17.2 m[IU]/mL (ref 2.6–18.5)

## 2020-08-06 NOTE — Progress Notes (Signed)
[facility]  Telephone:([phone] Fax:(336) [phone]  ID: EMMIE FRAKES OB: 1930/03/31  MR#: [id number]  LFY#:[id number]  Patient Care Team: Kirk Ruths, MD as PCP - General (Internal Medicine)  CHIEF COMPLAINT: Iron deficiency anemia.  INTERVAL HISTORY: Patient returns to clinic today for further evaluation, discussion of her laboratory results, and initiation of IV Venofer.  She continues to have chronic weakness and fatigue, but remains active. She has no neurologic complaints.  She has a good appetite and denies weight loss.  She denies any recent fevers or illnesses.  She has no chest pain, shortness of breath, cough, or hemoptysis.  She denies any nausea, vomiting, constipation, or diarrhea.  She has no melena or hematochezia.  She has no urinary complaints.  Patient offers no further specific complaints today.  REVIEW OF SYSTEMS:   Review of Systems  Constitutional:  Positive for malaise/fatigue. Negative for fever and weight loss.  Respiratory: Negative.  Negative for cough, hemoptysis and shortness of breath.   Cardiovascular: Negative.  Negative for chest pain and leg swelling.  Gastrointestinal: Negative.  Negative for abdominal pain, blood in stool and melena.  Genitourinary: Negative.  Negative for hematuria.  Musculoskeletal: Negative.  Negative for back pain.  Skin: Negative.  Negative for rash.  Neurological:  Positive for weakness. Negative for dizziness, focal weakness and headaches.  Psychiatric/Behavioral: Negative.  The patient is not nervous/anxious.    As per HPI. Otherwise, a complete review of systems is negative.  PAST MEDICAL HISTORY: History reviewed. No pertinent past medical history.  PAST SURGICAL HISTORY: No reported surgical history.  FAMILY HISTORY: History reviewed. No pertinent family history.  ADVANCED DIRECTIVES (Y/N):  N  HEALTH MAINTENANCE:     Colonoscopy:  PAP:  Bone density:  Lipid panel:  No  Known Allergies  Current Outpatient Medications  Medication Sig Dispense Refill   Calcium Carbonate-Vitamin D 600-200 MG-UNIT TABS Take 1 tablet by mouth daily.     cyanocobalamin (,VITAMIN B-12,) 1000 MCG/ML injection Inject into the muscle.     furosemide (LASIX) 20 MG tablet Take by mouth.     isosorbide mononitrate (IMDUR) 30 MG 24 hr tablet Take 1 tablet by mouth one time daily     levothyroxine (SYNTHROID) 100 MCG tablet Take by mouth.     metoprolol tartrate (LOPRESSOR) 100 MG tablet Take 1 tablet by mouth 2 (two) times daily.     mirtazapine (REMERON) 7.5 MG tablet Take by mouth.     potassium chloride (KLOR-CON) 8 MEQ tablet TAKE 1 TABLET BY MOUTH ONCE A DAY AS DIRECTED     simvastatin (ZOCOR) 40 MG tablet Take by mouth.     spironolactone (ALDACTONE) 25 MG tablet Take 1 tablet by mouth daily.     traZODone (DESYREL) 50 MG tablet Take by mouth.     No current facility-administered medications for this visit.    OBJECTIVE: Vitals:   08/11/20 1000 08/11/20 1004  BP:  (!) 163/66  Pulse:  (!) 53  Temp: 97.6 F (36.4 C)      Body mass index is 18.34 kg/m.    ECOG FS:0 - Asymptomatic  General: Well-developed, well-nourished, no acute distress. Eyes: Pink conjunctiva, anicteric sclera. HEENT: Normocephalic, moist mucous membranes. Lungs: No audible wheezing or coughing. Heart: Regular rate and rhythm. Abdomen: Soft, nontender, no obvious distention. Musculoskeletal: No edema, cyanosis, or clubbing. Neuro: Alert, answering all questions appropriately. Cranial nerves grossly intact. Skin: No rashes or petechiae noted. Psych: Normal affect.   LAB RESULTS:  No results found for: NA, K, CL, CO2, GLUCOSE, BUN, CREATININE, CALCIUM, PROT, ALBUMIN, AST, ALT, ALKPHOS, BILITOT, GFRNONAA, GFRAA  Lab Results  Component Value Date   WBC 6.6 07/26/2020   HGB 9.4 (L) 07/26/2020   HCT 31.1 (L) 07/26/2020   MCV 89.4 07/26/2020   PLT 252 07/26/2020   Lab Results  Component Value  Date   IRON 29 07/26/2020   TIBC 458 (H) 07/26/2020   IRONPCTSAT 6 (L) 07/26/2020   Lab Results  Component Value Date   FERRITIN 32 07/26/2020     STUDIES: No results found.  ASSESSMENT:  Iron deficiency anemia.  PLAN:    Iron deficiency anemia: Patient's hemoglobin and iron stores are decreased and she was noted to have an inappropriately normal reticulocyte count and erythropoietin level.  All of her other laboratory work including B12, folate, SPEP, and hemolysis labs are either negative or within normal limits.   Intelli[facility] Myeloid panel was drawn for completeness and is pending at time of dictation.  Proceed with 200 mg IV Venofer today.  Patient's daughter will be out of town next week, therefore patient will return to clinic in 2 and 3 weeks to receive 4 additional doses of IV Venofer.  She will then return to clinic in 4 months with repeat laboratory work, further evaluation, and continuation of treatment if needed.   Hypertension: Patient's blood pressure remains moderately elevated.  Continue evaluation and treatment per primary care.  I spent a total of 30 minutes reviewing chart data, face-to-face evaluation with the patient, counseling and coordination of care as detailed above.   Patient expressed understanding and was in agreement with this plan. She also understands that She can call clinic at any time with any questions, concerns, or complaints.    Lloyd Huger, MD   08/12/2020 2:46 PM

## 2020-08-08 DIAGNOSIS — I4891 Unspecified atrial fibrillation: Secondary | ICD-10-CM | POA: Diagnosis not present

## 2020-08-08 DIAGNOSIS — Z7901 Long term (current) use of anticoagulants: Secondary | ICD-10-CM | POA: Diagnosis not present

## 2020-08-08 DIAGNOSIS — E538 Deficiency of other specified B group vitamins: Secondary | ICD-10-CM | POA: Diagnosis not present

## 2020-08-11 ENCOUNTER — Inpatient Hospital Stay: Payer: Medicare HMO | Admitting: Oncology

## 2020-08-11 ENCOUNTER — Encounter: Payer: Self-pay | Admitting: Oncology

## 2020-08-11 ENCOUNTER — Inpatient Hospital Stay: Payer: Medicare HMO

## 2020-08-11 VITALS — BP 163/66 | HR 53 | Temp 97.6°F | Wt 113.6 lb

## 2020-08-11 VITALS — BP 157/70 | HR 55 | Resp 16

## 2020-08-11 DIAGNOSIS — D509 Iron deficiency anemia, unspecified: Secondary | ICD-10-CM | POA: Diagnosis not present

## 2020-08-11 DIAGNOSIS — I10 Essential (primary) hypertension: Secondary | ICD-10-CM | POA: Diagnosis not present

## 2020-08-11 MED ORDER — SODIUM CHLORIDE 0.9 % IV SOLN: 200.0000 mg | Freq: Once | INTRAVENOUS | Status: DC

## 2020-08-11 MED ORDER — SODIUM CHLORIDE 0.9 % IV SOLN
Freq: Once | INTRAVENOUS | Status: AC
Start: 2020-08-11 — End: 2020-08-11
  Filled 2020-08-11: qty 250

## 2020-08-11 MED ORDER — IRON SUCROSE 20 MG/ML IV SOLN
200.0000 mg | Freq: Once | INTRAVENOUS | Status: AC
  Administered 2020-08-11: 200 mg via INTRAVENOUS
  Filled 2020-08-11: qty 10

## 2020-08-11 NOTE — Progress Notes (Signed)
Patient denies any concerns today.  

## 2020-08-11 NOTE — Progress Notes (Signed)
Patient tolerated first Venofer infusion well. Patient monitored x 15 minutes post infusion. Patient and VSS. Discharged home.

## 2020-08-12 ENCOUNTER — Encounter: Payer: Self-pay | Admitting: Oncology

## 2020-08-18 LAB — INTELLIGEN MYELOID

## 2020-08-23 ENCOUNTER — Inpatient Hospital Stay: Payer: Medicare HMO | Attending: Oncology

## 2020-08-23 VITALS — BP 131/58 | HR 56 | Temp 97.5°F | Resp 16

## 2020-08-23 DIAGNOSIS — D509 Iron deficiency anemia, unspecified: Secondary | ICD-10-CM | POA: Insufficient documentation

## 2020-08-23 MED ORDER — SODIUM CHLORIDE 0.9 % IV SOLN
200.0000 mg | Freq: Once | INTRAVENOUS | Status: DC
  Filled 2020-08-23: qty 10

## 2020-08-23 MED ORDER — SODIUM CHLORIDE 0.9 % IV SOLN
Freq: Once | INTRAVENOUS | Status: AC
  Filled 2020-08-23: qty 250

## 2020-08-23 MED ORDER — IRON SUCROSE 20 MG/ML IV SOLN
200.0000 mg | Freq: Once | INTRAVENOUS | Status: AC
  Administered 2020-08-23: 200 mg via INTRAVENOUS
  Filled 2020-08-23: qty 10

## 2020-08-23 NOTE — Patient Instructions (Signed)

## 2020-08-25 ENCOUNTER — Inpatient Hospital Stay: Payer: Medicare HMO

## 2020-08-25 ENCOUNTER — Other Ambulatory Visit: Payer: Self-pay

## 2020-08-25 VITALS — BP 138/53 | HR 55

## 2020-08-25 DIAGNOSIS — D509 Iron deficiency anemia, unspecified: Secondary | ICD-10-CM | POA: Diagnosis not present

## 2020-08-25 MED ORDER — SODIUM CHLORIDE 0.9 % IV SOLN
Freq: Once | INTRAVENOUS | Status: AC
  Filled 2020-08-25: qty 250

## 2020-08-25 MED ORDER — IRON SUCROSE 20 MG/ML IV SOLN
200.0000 mg | Freq: Once | INTRAVENOUS | Status: AC
  Administered 2020-08-25: 200 mg via INTRAVENOUS
  Filled 2020-08-25: qty 10

## 2020-08-25 MED ORDER — SODIUM CHLORIDE 0.9 % IV SOLN: 200.0000 mg | Freq: Once | INTRAVENOUS | Status: DC

## 2020-08-25 NOTE — Patient Instructions (Signed)
[facility]  Discharge Instructions: Thank you for choosing [facility] to provide your oncology and hematology care.  If you have a lab appointment with the Cancer Center, please go directly to the Cancer Center and check in at the registration area.  Wear comfortable clothing and clothing appropriate for easy access to any Portacath or PICC line.   We strive to give you quality time with your provider. You may need to reschedule your appointment if you arrive late (15 or more minutes).  Arriving late affects you and other patients whose appointments are after yours.  Also, if you miss three or more appointments without notifying the office, you may be dismissed from the clinic at the provider's discretion.      For prescription refill requests, have your pharmacy contact our office and allow 72 hours for refills to be completed.    Today you received the following : Venofer   To help prevent nausea and vomiting after your treatment, we encourage you to take your nausea medication as directed.  BELOW ARE SYMPTOMS THAT SHOULD BE REPORTED IMMEDIATELY: . *FEVER GREATER THAN 100.4 F (38 C) OR HIGHER . *CHILLS OR SWEATING . *NAUSEA AND VOMITING THAT IS NOT CONTROLLED WITH YOUR NAUSEA MEDICATION . *UNUSUAL SHORTNESS OF BREATH . *UNUSUAL BRUISING OR BLEEDING . *URINARY PROBLEMS (pain or burning when urinating, or frequent urination) . *BOWEL PROBLEMS (unusual diarrhea, constipation, pain near the anus) . TENDERNESS IN MOUTH AND THROAT WITH OR WITHOUT PRESENCE OF ULCERS (sore throat, sores in mouth, or a toothache) . UNUSUAL RASH, SWELLING OR PAIN  . UNUSUAL VAGINAL DISCHARGE OR ITCHING   Items with * indicate a potential emergency and should be followed up as soon as possible or go to the Emergency Department if any problems should occur.  Please show the CHEMOTHERAPY ALERT CARD or IMMUNOTHERAPY ALERT CARD at check-in to the Emergency  Department and triage nurse.  Should you have questions after your visit or need to cancel or reschedule your appointment, please contact [facility]  [phone] and follow the prompts.  Office hours are 8:00 a.m. to 4:30 p.m. Monday - Friday. Please note that voicemails left after 4:00 p.m. may not be returned until the following business day.  We are closed weekends and major holidays. You have access to a nurse at all times for urgent questions. Please call the main number to the clinic [phone] and follow the prompts.  For any non-urgent questions, you may also contact your provider using MyChart. We now offer e-Visits for anyone 18 and older to request care online for non-urgent symptoms. For details visit [email or web address].   Also download the MyChart app! Go to the app store, search "MyChart", open the app, select [facility], and log in with your MyChart username and password.  Due to Covid, a mask is required upon entering the hospital/clinic. If you do not have a mask, one will be given to you upon arrival. For doctor visits, patients may have 1 support person aged 18 or older with them. For treatment visits, patients cannot have anyone with them due to current Covid guidelines and our immunocompromised population.  

## 2020-08-27 ENCOUNTER — Other Ambulatory Visit: Payer: Self-pay

## 2020-08-27 ENCOUNTER — Emergency Department
Admission: EM | Admit: 2020-08-27 | Discharge: 2020-08-27 | Disposition: A | Discharge status: Home / Self Care | Payer: Medicare HMO | Attending: Emergency Medicine | Admitting: Emergency Medicine

## 2020-08-27 DIAGNOSIS — E039 Hypothyroidism, unspecified: Secondary | ICD-10-CM | POA: Insufficient documentation

## 2020-08-27 DIAGNOSIS — I2510 Atherosclerotic heart disease of native coronary artery without angina pectoris: Secondary | ICD-10-CM | POA: Diagnosis not present

## 2020-08-27 DIAGNOSIS — R197 Diarrhea, unspecified: Secondary | ICD-10-CM | POA: Insufficient documentation

## 2020-08-27 DIAGNOSIS — Z79899 Other long term (current) drug therapy: Secondary | ICD-10-CM | POA: Diagnosis not present

## 2020-08-27 HISTORY — DX: Anemia, unspecified: D64.9

## 2020-08-27 LAB — CBC
HCT: 34.8 % — ABNORMAL LOW (ref 36.0–46.0)
Hemoglobin: 11.2 g/dL — ABNORMAL LOW (ref 12.0–15.0)
MCH: 29.2 pg (ref 26.0–34.0)
MCHC: 32.2 g/dL (ref 30.0–36.0)
MCV: 90.6 fL (ref 80.0–100.0)
Platelets: 202 10*3/uL (ref 150–400)
RBC: 3.84 MIL/uL — ABNORMAL LOW (ref 3.87–5.11)
RDW: 18.5 % — ABNORMAL HIGH (ref 11.5–15.5)
WBC: 5.1 10*3/uL (ref 4.0–10.5)
nRBC: 0 % (ref 0.0–0.2)

## 2020-08-27 LAB — URINALYSIS, COMPLETE (UACMP) WITH MICROSCOPIC
Bilirubin Urine: NEGATIVE
Glucose, UA: NEGATIVE mg/dL
Ketones, ur: NEGATIVE mg/dL
Leukocytes,Ua: NEGATIVE
Nitrite: NEGATIVE
Protein, ur: NEGATIVE mg/dL
Specific Gravity, Urine: 1.014 (ref 1.005–1.030)
pH: 5 (ref 5.0–8.0)

## 2020-08-27 LAB — COMPREHENSIVE METABOLIC PANEL
ALT: 13 U/L (ref 0–44)
AST: 40 U/L (ref 15–41)
Albumin: 4.1 g/dL (ref 3.5–5.0)
Alkaline Phosphatase: 58 U/L (ref 38–126)
Anion gap: 10 (ref 5–15)
BUN: 28 mg/dL — ABNORMAL HIGH (ref 8–23)
CO2: 24 mmol/L (ref 22–32)
Calcium: 9 mg/dL (ref 8.9–10.3)
Chloride: 102 mmol/L (ref 98–111)
Creatinine, Ser: 1.38 mg/dL — ABNORMAL HIGH (ref 0.44–1.00)
GFR, Estimated: 37 mL/min — ABNORMAL LOW (ref >60)
Glucose, Bld: 122 mg/dL — ABNORMAL HIGH (ref 70–99)
Potassium: 3.9 mmol/L (ref 3.5–5.1)
Sodium: 136 mmol/L (ref 135–145)
Total Bilirubin: 0.9 mg/dL (ref 0.3–1.2)
Total Protein: 7.3 g/dL (ref 6.5–8.1)

## 2020-08-27 LAB — LIPASE, BLOOD: Lipase: 60 U/L — ABNORMAL HIGH (ref 11–51)

## 2020-08-27 MED ORDER — LACTATED RINGERS IV BOLUS
1000.0000 mL | Freq: Once | INTRAVENOUS | Status: AC
  Administered 2020-08-27: 1000 mL via INTRAVENOUS

## 2020-08-27 NOTE — ED Triage Notes (Signed)
Pt reports had an infusion for iron deficiency anemia Thursday and thinks she is having a reaction to it. Pt c/o diarrhea and nausea.

## 2020-08-27 NOTE — ED Provider Notes (Signed)
[facility] Emergency Department Provider Note ____________________________________________   Event Date/Time   First MD Initiated Contact with Patient 08/27/20 [phone]     (approximate)  I have reviewed the triage vital signs and the nursing notes.  HISTORY  Chief Complaint Diarrhea   HPI Cindy Waters is a 87 y.o. femalewho presents to the ED for evaluation of diarrhea.   Chart review indicates hx and deficiency anemia, CAD, hypothyroidism, HLD.  Looks like weekly iron infusions started at the end of June after establishing with hematology due to her anemia.  Patient presents to the ED, accompanied by her daughter, for evaluation of diarrhea for the past 2 days.  She reports getting her third iron infusion 2 days ago, on Thursday.  And beginning that night she began having watery diarrhea that is continued.  She reports loose stools without hematochezia.  She reports some associated nausea but no emesis, fevers, abdominal pain, syncopal episodes, dysuria.   Past Medical History:  Diagnosis Date   Anemia     Patient Active Problem List   Diagnosis Date Noted   Iron deficiency anemia 07/27/2020   Anemia, unspecified 07/22/2020    No past surgical history on file.  Prior to Admission medications   Medication Sig Start Date End Date Taking? Authorizing Provider  Calcium Carbonate-Vitamin D 600-200 MG-UNIT TABS Take 1 tablet by mouth daily.    [provider]  cyanocobalamin (,VITAMIN B-12,) 1000 MCG/ML injection Inject into the muscle. 10/06/18   [provider]  furosemide (LASIX) 20 MG tablet Take by mouth. 03/15/20   [provider]  isosorbide mononitrate (IMDUR) 30 MG 24 hr tablet Take 1 tablet by mouth one time daily 04/22/20   [provider]  levothyroxine (SYNTHROID) 100 MCG tablet Take by mouth. 03/15/20 03/15/21  [provider]  metoprolol tartrate (LOPRESSOR) 100 MG tablet Take 1 tablet by mouth 2  (two) times daily. 03/08/20   [provider]  mirtazapine (REMERON) 7.5 MG tablet Take by mouth. 04/22/20   [provider]  potassium chloride (KLOR-CON) 8 MEQ tablet TAKE 1 TABLET BY MOUTH ONCE A DAY AS DIRECTED 02/10/20   [provider]  simvastatin (ZOCOR) 40 MG tablet Take by mouth. 06/07/20   [provider]  spironolactone (ALDACTONE) 25 MG tablet Take 1 tablet by mouth daily. 03/15/20   [provider]  traZODone (DESYREL) 50 MG tablet Take by mouth. 05/05/19   [provider]    Allergies Patient has no known allergies.  No family history on file.  Social History    Review of Systems  Constitutional: No fever/chills Eyes: No visual changes. ENT: No sore throat. Cardiovascular: Denies chest pain. Respiratory: Denies shortness of breath. Gastrointestinal: No abdominal pain.  no vomiting.  No constipation. Positive for diarrhea and nausea without vomiting Genitourinary: Negative for dysuria. Musculoskeletal: Negative for back pain. Skin: Negative for rash. Neurological: Negative for headaches, focal weakness or numbness.  ____________________________________________   PHYSICAL EXAM:  VITAL SIGNS: Vitals:   08/27/20 1300 08/27/20 1432  BP:  (!) 159/77  Pulse: 69 70  Resp:  16  Temp:    SpO2: 100% 99%     Constitutional: Alert and oriented. Well appearing and in no acute distress.  Pleasant and conversational full sentences. Eyes: Conjunctivae are normal. PERRL. EOMI. Head: Atraumatic. Nose: No congestion/rhinnorhea. Mouth/Throat: Mucous membranes are moist.  Oropharynx non-erythematous. Neck: No stridor. No cervical spine tenderness to palpation. Cardiovascular: Normal rate, regular rhythm. Grossly normal heart  sounds.  Good peripheral circulation. Respiratory: Normal respiratory effort.  No retractions. Lungs CTAB. Gastrointestinal: Soft , nondistended, nontender to palpation. No CVA tenderness.  Soft and  benign throughout. Musculoskeletal: No lower extremity tenderness nor edema.  No joint effusions. No signs of acute trauma. Neurologic:  Normal speech and language. No gross focal neurologic deficits are appreciated. No gait instability noted. Skin:  Skin is warm, dry and intact. No rash noted. Psychiatric: Mood and affect are normal. Speech and behavior are normal.  ____________________________________________   LABS (all labs ordered are listed, but only abnormal results are displayed)  Labs Reviewed  LIPASE, BLOOD - Abnormal; Notable for the following components:      Result Value   Lipase 60 (*)    All other components within normal limits  COMPREHENSIVE METABOLIC PANEL - Abnormal; Notable for the following components:   Glucose, Bld 122 (*)    BUN 28 (*)    Creatinine, Ser 1.38 (*)    GFR, Estimated 37 (*)    All other components within normal limits  CBC - Abnormal; Notable for the following components:   RBC 3.84 (*)    Hemoglobin 11.2 (*)    HCT 34.8 (*)    RDW 18.5 (*)    All other components within normal limits  URINALYSIS, COMPLETE (UACMP) WITH MICROSCOPIC - Abnormal; Notable for the following components:   Color, Urine YELLOW (*)    APPearance CLEAR (*)    Hgb urine dipstick MODERATE (*)    Bacteria, UA RARE (*)    All other components within normal limits   ____________________________________________  12 Lead EKG   ____________________________________________  RADIOLOGY  ED MD interpretation:    Official radiology report(s): No results found.  ____________________________________________   PROCEDURES and INTERVENTIONS  Procedure(s) performed (including Critical Care):  Procedures  Medications  lactated ringers bolus 1,000 mL (0 mLs Intravenous Stopped 08/27/20 1432)    ____________________________________________   MDM / ED COURSE   Pleasant and rather functional 87 year old woman presents to the ED with couple days of watery diarrhea  after receiving iron transfusions, likely medication side effects, and amenable to outpatient management.  She looks well to me clinically without evidence of significant dehydration.  She has a benign abdomen without any pain or tenderness.  No indications for abdominal imaging at this time.  Blood and urinalysis are reassuring without evidence of AKI, acute cystitis.  Provided IV rehydration and monitor patient for few hours without worsening or indications for further work-up here in the ED.  Advised OTC medications to assist with possible medication side effect and following up with her hematologist.  We discussed return precautions for the ED.  Patient stable for outpatient management.   Clinical Course as of 08/27/20 1529  Sat Aug 27, 2020  1024 Review of care everywhere labs, GFR stays in the 30s [DS]  1050 Discussed plan of care with patient and daughter, to include IV rehydration, urinalysis and p.o. challenge.  They are agreeable. [DS]  8315 [id number].  Patient doing well.  She has no complaints and reports feeling well.  Continues to have a benign abdominal examination.  We discussed as needed OTC treatments for diarrhea such as Imodium, associated with her transfusions.  We discussed return precautions [DS]    Clinical Course User Index [DS] Vladimir Crofts, MD    ____________________________________________   FINAL CLINICAL IMPRESSION(S) / ED DIAGNOSES  Final diagnoses:  Diarrhea, unspecified type     ED Discharge Orders     None  Vladimir Crofts   Note:  This document was prepared using Systems analyst and may include unintentional dictation errors.    Vladimir Crofts, MD 08/27/20 [phone]

## 2020-08-27 NOTE — ED Notes (Signed)
Stool sample sent to lab for holding

## 2020-08-27 NOTE — ED Triage Notes (Signed)
See triage note, pt reports diarrhea and nausea since iron infusion on Thursday, 3rd infusion, has never had rx before.  Diarrhea x3 in past 24 hours. Denies pain NAD noted, ambulatory

## 2020-08-27 NOTE — Discharge Instructions (Addendum)
Try some over-the-counter Imodium for your diarrhea.  Follow-up with the hematologist for your next iron transfusion.  Return to the ED with any diarrhea associated with abdominal pain, fevers, or bloody diarrhea

## 2020-08-29 ENCOUNTER — Telehealth: Payer: Self-pay | Admitting: Oncology

## 2020-08-29 NOTE — Telephone Encounter (Signed)
Not sure if you are working with me tomorrow but she should come in for IV fluids this week and we can check her stool.   Faythe Casa, NP 08/29/2020 4:24 PM

## 2020-08-29 NOTE — Telephone Encounter (Addendum)
Patient called and left message stating she has had very bad diarrhea since her iron infusion on last Thursday.  Patient went to ED on Saturday and got fluids for dehydration, she has been taking Imodium since and is not getting any relief.  Patient states she is unable to sit comfortably for the infusion.  Canceling appointment for 7/19 per patient and rescheduled to Tuesday 8/2.  Routing to clinical team for follow up.

## 2020-08-30 ENCOUNTER — Telehealth: Payer: Self-pay | Admitting: Oncology

## 2020-08-30 ENCOUNTER — Inpatient Hospital Stay: Payer: Medicare HMO

## 2020-08-30 NOTE — Telephone Encounter (Signed)
Spoke with patient. She stated that she does not want to come in this week--and would rather be seen next Tuesday. She is scheduled to see Josh/Venofer +/- IVF on 7/26.

## 2020-08-30 NOTE — Telephone Encounter (Signed)
Called patient to schedule visit with NP prior to her iron infusion on 7/21 (due to diarrhea). Patient stated that she did not want to come in this week and that the diarrhea is "better". She wanted to r/s her iron infusion as well. Venofer and NP visit rescheduled to 7/26. RN made aware.

## 2020-08-30 NOTE — Telephone Encounter (Signed)
Left message with patient to call and get scheduled for fluids and stool check per JB.

## 2020-08-31 ENCOUNTER — Encounter: Payer: Self-pay | Admitting: Oncology

## 2020-09-01 ENCOUNTER — Encounter: Payer: Medicare HMO | Admitting: Hospice and Palliative Medicine

## 2020-09-01 ENCOUNTER — Inpatient Hospital Stay: Payer: Medicare HMO

## 2020-09-06 ENCOUNTER — Inpatient Hospital Stay (HOSPITAL_BASED_OUTPATIENT_CLINIC_OR_DEPARTMENT_OTHER): Payer: Medicare HMO | Admitting: Hospice and Palliative Medicine

## 2020-09-06 ENCOUNTER — Inpatient Hospital Stay: Payer: Medicare HMO

## 2020-09-06 VITALS — BP 122/88 | HR 70 | Temp 98.5°F | Resp 18 | Wt 112.0 lb

## 2020-09-06 DIAGNOSIS — D509 Iron deficiency anemia, unspecified: Secondary | ICD-10-CM

## 2020-09-06 MED ORDER — IRON SUCROSE 20 MG/ML IV SOLN
200.0000 mg | Freq: Once | INTRAVENOUS | Status: AC
  Administered 2020-09-06: 200 mg via INTRAVENOUS
  Filled 2020-09-06: qty 10

## 2020-09-06 MED ORDER — SODIUM CHLORIDE 0.9 % IV SOLN: 200.0000 mg | Freq: Once | INTRAVENOUS | Status: DC

## 2020-09-06 MED ORDER — SODIUM CHLORIDE 0.9 % IV SOLN
Freq: Once | INTRAVENOUS | Status: AC
Start: 2020-09-06 — End: 2020-09-06
  Filled 2020-09-06: qty 250

## 2020-09-06 NOTE — Progress Notes (Signed)
[facility]  [phone] [phone]  Patient Care Team: Kirk Ruths, MD as PCP - General (Internal Medicine) Lloyd Huger, MD as Consulting Physician (Hematology and Oncology)   Name of the patient: Cindy Waters  CZ:[id number]  Jun 04, 1930   Date of visit: 09/06/20  Reason for Consult:  [name] is an 87 year old woman with iron deficiency anemia.  Patient last saw Dr. Grayland Ormond on 08/11/2020 and was found to have low iron stores.  She was started on Venofer with plan for total of four '200mg'$  doses. She has received 3 doses of IV Venofer so far (last on 08/25/2020).  Patient was seen in ER on 08/27/2020 with complaint of diarrhea starting after she received IV iron.  Patient was given IV fluids and recommended OTC antidiarrheals.  She called the clinic on on 08/29/2020 to update [place] regarding the diarrhea.  Clinic visit was offered but patient reported that the diarrhea had improved and she requested to be seen on 09/06/2020 prior to her next scheduled dose of Venofer.   Today, patient reports that she is doing well.  She denies any symptomatic complaints today.  She says diarrhea lasted about 4 to 5 days in total.  She did not have abdominal pain or bloody stools.  She did not have nausea or vomiting or fever/chills.  Denies any neurologic complaints. Denies recent fevers or illnesses. Denies any easy bleeding or bruising. Reports good appetite and denies weight loss. Denies chest pain. Denies any nausea, vomiting, constipation, or diarrhea. Denies urinary complaints. Patient offers no further specific complaints today.  PAST MEDICAL HISTORY: Past Medical History:  Diagnosis Date   Anemia     PAST SURGICAL HISTORY: No past surgical history on file.  HEMATOLOGY/ONCOLOGY HISTORY:  Oncology History   No history exists.    ALLERGIES:  has No Known Allergies.  MEDICATIONS:  Current Outpatient  Medications  Medication Sig Dispense Refill   Calcium Carbonate-Vitamin D 600-200 MG-UNIT TABS Take 1 tablet by mouth daily.     cyanocobalamin (,VITAMIN B-12,) 1000 MCG/ML injection Inject into the muscle.     furosemide (LASIX) 20 MG tablet Take by mouth.     isosorbide mononitrate (IMDUR) 30 MG 24 hr tablet Take 1 tablet by mouth one time daily     levothyroxine (SYNTHROID) 100 MCG tablet Take by mouth.     metoprolol tartrate (LOPRESSOR) 100 MG tablet Take 1 tablet by mouth 2 (two) times daily.     mirtazapine (REMERON) 7.5 MG tablet Take by mouth.     potassium chloride (KLOR-CON) 8 MEQ tablet TAKE 1 TABLET BY MOUTH ONCE A DAY AS DIRECTED     simvastatin (ZOCOR) 40 MG tablet Take by mouth.     spironolactone (ALDACTONE) 25 MG tablet Take 1 tablet by mouth daily.     traZODone (DESYREL) 50 MG tablet Take by mouth.     No current facility-administered medications for this visit.    VITAL SIGNS: There were no vitals taken for this visit. There were no vitals filed for this visit.  Estimated body mass index is 18.24 kg/m as calculated from the following:   Height as of 08/27/20: '5\' 6"'$  (1.676 m).   Weight as of 08/27/20: 113 lb (51.3 kg).  LABS: CBC:    Component Value Date/Time   WBC 5.1 08/27/2020 0923   HGB 11.2 (L) 08/27/2020 0923   HCT 34.8 (L) 08/27/2020 0923   PLT 202 08/27/2020 0923   MCV  90.6 08/27/2020 0923   Comprehensive Metabolic Panel:    Component Value Date/Time   NA 136 08/27/2020 0923   K 3.9 08/27/2020 0923   CL 102 08/27/2020 0923   CO2 24 08/27/2020 0923   BUN 28 (H) 08/27/2020 0923   CREATININE 1.38 (H) 08/27/2020 0923   GLUCOSE 122 (H) 08/27/2020 0923   CALCIUM 9.0 08/27/2020 0923   AST 40 08/27/2020 0923   ALT 13 08/27/2020 0923   ALKPHOS 58 08/27/2020 0923   BILITOT 0.9 08/27/2020 0923   PROT 7.3 08/27/2020 0923   ALBUMIN 4.1 08/27/2020 0923    RADIOGRAPHIC STUDIES: No results found.  PERFORMANCE STATUS (ECOG) : 0 -  Asymptomatic  Review of Systems Unless otherwise noted, a complete review of systems is negative.  Physical Exam General: NAD Cardiovascular: regular rate and rhythm Pulmonary: clear ant fields Abdomen: soft, nontender, + bowel sounds GU: no suprapubic tenderness Extremities: no edema, no joint deformities Skin: no rashes Neurological: Grossly nonfocal  Assessment and Plan- Patient is a 87 y.o. female with IDA on treatments with Venofer who was seen prior to her next dose due to recent diarrhea   Diarrhea -this has completely resolved.  Patient is now completely asymptomatic.  Unclear etiology.  Venofer can cause diarrhea in <5% of patients. However, she tolerated the first two doses without issue.  Discussed ongoing treatments as patient is scheduled for IV iron today and again next week.  Patient would like to continue receiving IV iron.  Will monitor for adverse effects and treat symptomatically or could opt to discontinue further treatments if needed.  Discussed utilization of [facility] if needed.  Otherwise, patient is scheduled for labs/MD visit on 12/15/2020.   Case and plan discussed with Dr. Grayland Ormond   Patient expressed understanding and was in agreement with this plan. She also understands that She can call clinic at any time with any questions, concerns, or complaints.   Thank you for allowing me to participate in the care of this very pleasant patient.   Time Total: 15 minutes  Visit consisted of counseling and education dealing with the complex and emotionally intense issues of symptom management and palliative care in the setting of serious and potentially life-threatening illness.Greater than 50%  of this time was spent counseling and coordinating care related to the above assessment and plan.  Signed by: Altha Harm, PhD, NP-C

## 2020-09-06 NOTE — Patient Instructions (Signed)
[facility]  Discharge Instructions: Thank you for choosing [facility] to provide your oncology and hematology care.  If you have a lab appointment with the Cancer Center, please go directly to the Cancer Center and check in at the registration area.  Wear comfortable clothing and clothing appropriate for easy access to any Portacath or PICC line.   We strive to give you quality time with your provider. You may need to reschedule your appointment if you arrive late (15 or more minutes).  Arriving late affects you and other patients whose appointments are after yours.  Also, if you miss three or more appointments without notifying the office, you may be dismissed from the clinic at the provider's discretion.      For prescription refill requests, have your pharmacy contact our office and allow 72 hours for refills to be completed.    Today you received the following : Venofer   To help prevent nausea and vomiting after your treatment, we encourage you to take your nausea medication as directed.  BELOW ARE SYMPTOMS THAT SHOULD BE REPORTED IMMEDIATELY: . *FEVER GREATER THAN 100.4 F (38 C) OR HIGHER . *CHILLS OR SWEATING . *NAUSEA AND VOMITING THAT IS NOT CONTROLLED WITH YOUR NAUSEA MEDICATION . *UNUSUAL SHORTNESS OF BREATH . *UNUSUAL BRUISING OR BLEEDING . *URINARY PROBLEMS (pain or burning when urinating, or frequent urination) . *BOWEL PROBLEMS (unusual diarrhea, constipation, pain near the anus) . TENDERNESS IN MOUTH AND THROAT WITH OR WITHOUT PRESENCE OF ULCERS (sore throat, sores in mouth, or a toothache) . UNUSUAL RASH, SWELLING OR PAIN  . UNUSUAL VAGINAL DISCHARGE OR ITCHING   Items with * indicate a potential emergency and should be followed up as soon as possible or go to the Emergency Department if any problems should occur.  Please show the CHEMOTHERAPY ALERT CARD or IMMUNOTHERAPY ALERT CARD at check-in to the Emergency  Department and triage nurse.  Should you have questions after your visit or need to cancel or reschedule your appointment, please contact [facility] [place] [facility]  [phone] and follow the prompts.  Office hours are 8:00 a.m. to 4:30 p.m. Monday - Friday. Please note that voicemails left after 4:00 p.m. may not be returned until the following business day.  We are closed weekends and major holidays. You have access to a nurse at all times for urgent questions. Please call the main number to the clinic [phone] and follow the prompts.  For any non-urgent questions, you may also contact your provider using MyChart. We now offer e-Visits for anyone 18 and older to request care online for non-urgent symptoms. For details visit [email or web address].   Also download the MyChart app! Go to the app store, search "MyChart", open the app, select [facility], and log in with your MyChart username and password.  Due to Covid, a mask is required upon entering the hospital/clinic. If you do not have a mask, one will be given to you upon arrival. For doctor visits, patients may have 1 support person aged 18 or older with them. For treatment visits, patients cannot have anyone with them due to current Covid guidelines and our immunocompromised population.  

## 2020-09-13 ENCOUNTER — Inpatient Hospital Stay: Payer: Medicare HMO | Attending: Oncology

## 2020-09-13 VITALS — BP 128/64 | HR 74 | Temp 98.4°F | Resp 18

## 2020-09-13 DIAGNOSIS — D509 Iron deficiency anemia, unspecified: Secondary | ICD-10-CM | POA: Insufficient documentation

## 2020-09-13 MED ORDER — SODIUM CHLORIDE 0.9 % IV SOLN
Freq: Once | INTRAVENOUS | Status: AC
  Filled 2020-09-13: qty 250

## 2020-09-13 MED ORDER — SODIUM CHLORIDE 0.9 % IV SOLN: 200.0000 mg | Freq: Once | INTRAVENOUS | Status: DC

## 2020-09-13 MED ORDER — IRON SUCROSE 20 MG/ML IV SOLN
200.0000 mg | Freq: Once | INTRAVENOUS | Status: AC
Start: 2020-09-13 — End: 2020-09-13
  Administered 2020-09-13: 200 mg via INTRAVENOUS
  Filled 2020-09-13: qty 10

## 2020-09-13 NOTE — Patient Instructions (Signed)

## 2020-09-21 DIAGNOSIS — Z7901 Long term (current) use of anticoagulants: Secondary | ICD-10-CM | POA: Diagnosis not present

## 2020-09-21 DIAGNOSIS — E538 Deficiency of other specified B group vitamins: Secondary | ICD-10-CM | POA: Diagnosis not present

## 2020-09-21 DIAGNOSIS — D509 Iron deficiency anemia, unspecified: Secondary | ICD-10-CM | POA: Diagnosis not present

## 2020-09-21 DIAGNOSIS — D649 Anemia, unspecified: Secondary | ICD-10-CM | POA: Diagnosis not present

## 2020-10-25 DIAGNOSIS — E039 Hypothyroidism, unspecified: Secondary | ICD-10-CM | POA: Diagnosis not present

## 2020-10-25 DIAGNOSIS — E538 Deficiency of other specified B group vitamins: Secondary | ICD-10-CM | POA: Diagnosis not present

## 2020-10-25 DIAGNOSIS — D509 Iron deficiency anemia, unspecified: Secondary | ICD-10-CM | POA: Diagnosis not present

## 2020-10-25 DIAGNOSIS — I25719 Atherosclerosis of autologous vein coronary artery bypass graft(s) with unspecified angina pectoris: Secondary | ICD-10-CM | POA: Diagnosis not present

## 2020-10-25 DIAGNOSIS — R399 Unspecified symptoms and signs involving the genitourinary system: Secondary | ICD-10-CM | POA: Diagnosis not present

## 2020-10-25 DIAGNOSIS — J449 Chronic obstructive pulmonary disease, unspecified: Secondary | ICD-10-CM | POA: Diagnosis not present

## 2020-10-25 DIAGNOSIS — Z7901 Long term (current) use of anticoagulants: Secondary | ICD-10-CM | POA: Diagnosis not present

## 2020-10-25 DIAGNOSIS — I420 Dilated cardiomyopathy: Secondary | ICD-10-CM | POA: Diagnosis not present

## 2020-10-25 DIAGNOSIS — Z23 Encounter for immunization: Secondary | ICD-10-CM | POA: Diagnosis not present

## 2020-10-25 DIAGNOSIS — I4891 Unspecified atrial fibrillation: Secondary | ICD-10-CM | POA: Diagnosis not present

## 2020-11-25 DIAGNOSIS — Z7901 Long term (current) use of anticoagulants: Secondary | ICD-10-CM | POA: Diagnosis not present

## 2020-11-25 DIAGNOSIS — I4891 Unspecified atrial fibrillation: Secondary | ICD-10-CM | POA: Diagnosis not present

## 2020-11-25 DIAGNOSIS — E538 Deficiency of other specified B group vitamins: Secondary | ICD-10-CM | POA: Diagnosis not present

## 2020-12-09 NOTE — Progress Notes (Signed)
[place]  [phone] Fax:(336) [phone]  ID: Cindy Waters OB: 1930-04-15  MR#: [id number]  SLH#:[id number]  Patient Care Team: Kirk Ruths, MD as PCP - General (Internal Medicine) Lloyd Huger, MD as Consulting Physician (Hematology and Oncology)  CHIEF COMPLAINT: Iron deficiency anemia.  INTERVAL HISTORY: Patient returns to clinic today for laboratory work, further evaluation, and consideration of additional IV Venofer.  She currently feels well and is asymptomatic.  She does not complain of any weakness or fatigue today.  She has no neurologic complaints.  She has a good appetite and denies weight loss.  She denies any recent fevers or illnesses.  She has no chest pain, shortness of breath, cough, or hemoptysis.  She denies any nausea, vomiting, constipation, or diarrhea.  She has no melena or hematochezia.  She has no urinary complaints.  Patient offers no specific complaints today.  REVIEW OF SYSTEMS:   Review of Systems  Constitutional: Negative.  Negative for fever, malaise/fatigue and weight loss.  Respiratory: Negative.  Negative for cough, hemoptysis and shortness of breath.   Cardiovascular: Negative.  Negative for chest pain and leg swelling.  Gastrointestinal: Negative.  Negative for abdominal pain, blood in stool and melena.  Genitourinary: Negative.  Negative for hematuria.  Musculoskeletal: Negative.  Negative for back pain.  Skin: Negative.  Negative for rash.  Neurological: Negative.  Negative for dizziness, focal weakness, weakness and headaches.  Psychiatric/Behavioral: Negative.  The patient is not nervous/anxious.    As per HPI. Otherwise, a complete review of systems is negative.  PAST MEDICAL HISTORY: Past Medical History:  Diagnosis Date   Anemia     PAST SURGICAL HISTORY: No reported surgical history.  FAMILY HISTORY: No family history on file.  ADVANCED DIRECTIVES (Y/N):  N  HEALTH  MAINTENANCE:     Colonoscopy:  PAP:  Bone density:  Lipid panel:  No Known Allergies  Current Outpatient Medications  Medication Sig Dispense Refill   acetaminophen (TYLENOL) 500 MG tablet Take 500 mg by mouth every 6 (six) hours as needed.     Calcium Carbonate-Vitamin D 600-200 MG-UNIT TABS Take 1 tablet by mouth daily.     cyanocobalamin (,VITAMIN B-12,) 1000 MCG/ML injection Inject into the muscle.     ferrous sulfate 325 (65 FE) MG tablet Take 325 mg by mouth daily with breakfast.     levothyroxine (SYNTHROID) 100 MCG tablet Take by mouth.     metoprolol tartrate (LOPRESSOR) 100 MG tablet Take 1 tablet by mouth 2 (two) times daily.     mirtazapine (REMERON) 7.5 MG tablet Take by mouth.     potassium chloride (KLOR-CON) 8 MEQ tablet TAKE 1 TABLET BY MOUTH ONCE A DAY AS DIRECTED     simvastatin (ZOCOR) 40 MG tablet Take by mouth.     furosemide (LASIX) 20 MG tablet Take by mouth. (Patient not taking: Reported on 12/15/2020)     isosorbide mononitrate (IMDUR) 30 MG 24 hr tablet Take 1 tablet by mouth one time daily (Patient not taking: Reported on 12/15/2020)     spironolactone (ALDACTONE) 25 MG tablet Take 1 tablet by mouth daily. (Patient not taking: Reported on 12/15/2020)     traZODone (DESYREL) 50 MG tablet Take by mouth. (Patient not taking: Reported on 12/15/2020)     warfarin (COUMADIN) 2 MG tablet Take 2 mg by mouth daily. ON HOLD AT THIS TIME (Patient not taking: No sig reported)     No current facility-administered medications for this visit.  OBJECTIVE: Vitals:   12/15/20 1320  BP: 136/73  Pulse: 69  Resp: 16  Temp: (!) 97.2 F (36.2 C)  SpO2: 99%     Body mass index is 17.95 kg/m.    ECOG FS:0 - Asymptomatic  General: Well-developed, well-nourished, no acute distress. Eyes: Pink conjunctiva, anicteric sclera. HEENT: Normocephalic, moist mucous membranes. Lungs: No audible wheezing or coughing. Heart: Regular rate and rhythm. Abdomen: Soft, nontender, no  obvious distention. Musculoskeletal: No edema, cyanosis, or clubbing. Neuro: Alert, answering all questions appropriately. Cranial nerves grossly intact. Skin: No rashes or petechiae noted. Psych: Normal affect.  LAB RESULTS:  Lab Results  Component Value Date   NA 136 08/27/2020   K 3.9 08/27/2020   CL 102 08/27/2020   CO2 24 08/27/2020   GLUCOSE 122 (H) 08/27/2020   BUN 28 (H) 08/27/2020   CREATININE 1.38 (H) 08/27/2020   CALCIUM 9.0 08/27/2020   PROT 7.3 08/27/2020   ALBUMIN 4.1 08/27/2020   AST 40 08/27/2020   ALT 13 08/27/2020   ALKPHOS 58 08/27/2020   BILITOT 0.9 08/27/2020   GFRNONAA 37 (L) 08/27/2020    Lab Results  Component Value Date   WBC 6.9 12/15/2020   NEUTROABS 4.6 12/15/2020   HGB 10.5 (L) 12/15/2020   HCT 32.2 (L) 12/15/2020   MCV 100.3 (H) 12/15/2020   PLT 170 12/15/2020   Lab Results  Component Value Date   IRON 29 07/26/2020   TIBC 458 (H) 07/26/2020   IRONPCTSAT 6 (L) 07/26/2020   Lab Results  Component Value Date   FERRITIN 32 07/26/2020     STUDIES: No results found.  ASSESSMENT:  Iron deficiency anemia.  PLAN:    Iron deficiency anemia: Patient's hemoglobin is improved to 10.5, iron stores are pending at time of dictation.  Previously, all of her other laboratory work including B12, folate, SPEP, and hemolysis labs are either negative or within normal limits. 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  Patient does not require bone marrow biopsy at this time.  She does not require additional IV Venofer. She last received treatment on September 13, 2020.  She has been instructed to continue oral iron and B12 supplementation.  Return to clinic in 3 months with repeat laboratory work, further evaluation, and consideration of treatment if needed.   Hypertension: Patient's blood pressure is within normal limits today. Elevated MCV: Possibly secondary to underlying MDS.  Will  repeat B12 and folate levels at next clinic visit.  Continue oral B12 supplementation as above.  Patient expressed understanding and was in agreement with this plan. She also understands that She can call clinic at any time with any questions, concerns, or complaints.    Lloyd Huger, MD   12/15/2020 2:27 PM

## 2020-12-14 ENCOUNTER — Other Ambulatory Visit: Payer: Self-pay | Admitting: Emergency Medicine

## 2020-12-14 DIAGNOSIS — D509 Iron deficiency anemia, unspecified: Secondary | ICD-10-CM

## 2020-12-15 ENCOUNTER — Other Ambulatory Visit: Payer: Self-pay

## 2020-12-15 ENCOUNTER — Inpatient Hospital Stay: Payer: Medicare HMO | Admitting: Oncology

## 2020-12-15 ENCOUNTER — Inpatient Hospital Stay: Payer: Medicare HMO

## 2020-12-15 ENCOUNTER — Inpatient Hospital Stay: Payer: Medicare HMO | Attending: Oncology

## 2020-12-15 VITALS — BP 136/73 | HR 69 | Temp 97.2°F | Resp 16 | Wt 111.2 lb

## 2020-12-15 DIAGNOSIS — D509 Iron deficiency anemia, unspecified: Secondary | ICD-10-CM | POA: Insufficient documentation

## 2020-12-15 LAB — CBC WITH DIFFERENTIAL/PLATELET
Abs Immature Granulocytes: 0.02 10*3/uL (ref 0.00–0.07)
Basophils Absolute: 0 10*3/uL (ref 0.0–0.1)
Basophils Relative: 1 %
Eosinophils Absolute: 0.2 10*3/uL (ref 0.0–0.5)
Eosinophils Relative: 4 %
HCT: 32.2 % — ABNORMAL LOW (ref 36.0–46.0)
Hemoglobin: 10.5 g/dL — ABNORMAL LOW (ref 12.0–15.0)
Immature Granulocytes: 0 %
Lymphocytes Relative: 19 %
Lymphs Abs: 1.3 10*3/uL (ref 0.7–4.0)
MCH: 32.7 pg (ref 26.0–34.0)
MCHC: 32.6 g/dL (ref 30.0–36.0)
MCV: 100.3 fL — ABNORMAL HIGH (ref 80.0–100.0)
Monocytes Absolute: 0.8 10*3/uL (ref 0.1–1.0)
Monocytes Relative: 11 %
Neutro Abs: 4.6 10*3/uL (ref 1.7–7.7)
Neutrophils Relative %: 65 %
Platelets: 170 10*3/uL (ref 150–400)
RBC: 3.21 MIL/uL — ABNORMAL LOW (ref 3.87–5.11)
RDW: 12.4 % (ref 11.5–15.5)
WBC: 6.9 10*3/uL (ref 4.0–10.5)
nRBC: 0 % (ref 0.0–0.2)

## 2020-12-15 LAB — FERRITIN: Ferritin: 327 ng/mL — ABNORMAL HIGH (ref 11–307)

## 2020-12-15 LAB — IRON AND TIBC
Iron: 79 ug/dL (ref 28–170)
Saturation Ratios: 27 % (ref 10.4–31.8)
TIBC: 290 ug/dL (ref 250–450)
UIBC: 211 ug/dL

## 2020-12-15 NOTE — Progress Notes (Signed)
Pt has no concerns/complaints at this time. 

## 2020-12-27 DIAGNOSIS — E538 Deficiency of other specified B group vitamins: Secondary | ICD-10-CM | POA: Diagnosis not present

## 2021-01-30 DIAGNOSIS — E538 Deficiency of other specified B group vitamins: Secondary | ICD-10-CM | POA: Diagnosis not present

## 2021-03-02 DIAGNOSIS — E538 Deficiency of other specified B group vitamins: Secondary | ICD-10-CM | POA: Diagnosis not present

## 2021-03-09 ENCOUNTER — Other Ambulatory Visit: Payer: Self-pay | Admitting: *Deleted

## 2021-03-09 DIAGNOSIS — D509 Iron deficiency anemia, unspecified: Secondary | ICD-10-CM

## 2021-03-17 ENCOUNTER — Other Ambulatory Visit: Payer: Self-pay

## 2021-03-17 ENCOUNTER — Inpatient Hospital Stay: Payer: Medicare HMO | Attending: Nurse Practitioner

## 2021-03-17 ENCOUNTER — Encounter (INDEPENDENT_AMBULATORY_CARE_PROVIDER_SITE_OTHER): Payer: Self-pay

## 2021-03-17 DIAGNOSIS — D509 Iron deficiency anemia, unspecified: Secondary | ICD-10-CM | POA: Diagnosis not present

## 2021-03-17 LAB — CBC WITH DIFFERENTIAL/PLATELET
Abs Immature Granulocytes: 0.03 10*3/uL (ref 0.00–0.07)
Basophils Absolute: 0 10*3/uL (ref 0.0–0.1)
Basophils Relative: 1 %
Eosinophils Absolute: 0.4 10*3/uL (ref 0.0–0.5)
Eosinophils Relative: 6 %
HCT: 35.1 % — ABNORMAL LOW (ref 36.0–46.0)
Hemoglobin: 11.3 g/dL — ABNORMAL LOW (ref 12.0–15.0)
Immature Granulocytes: 1 %
Lymphocytes Relative: 21 %
Lymphs Abs: 1.3 10*3/uL (ref 0.7–4.0)
MCH: 32.5 pg (ref 26.0–34.0)
MCHC: 32.2 g/dL (ref 30.0–36.0)
MCV: 100.9 fL — ABNORMAL HIGH (ref 80.0–100.0)
Monocytes Absolute: 0.8 10*3/uL (ref 0.1–1.0)
Monocytes Relative: 13 %
Neutro Abs: 3.6 10*3/uL (ref 1.7–7.7)
Neutrophils Relative %: 58 %
Platelets: 197 10*3/uL (ref 150–400)
RBC: 3.48 MIL/uL — ABNORMAL LOW (ref 3.87–5.11)
RDW: 13.4 % (ref 11.5–15.5)
WBC: 6.1 10*3/uL (ref 4.0–10.5)
nRBC: 0 % (ref 0.0–0.2)

## 2021-03-17 LAB — FOLATE: Folate: 9.7 ng/mL (ref >5.9)

## 2021-03-17 LAB — IRON AND TIBC
Iron: 103 ug/dL (ref 28–170)
Saturation Ratios: 34 % — ABNORMAL HIGH (ref 10.4–31.8)
TIBC: 301 ug/dL (ref 250–450)
UIBC: 198 ug/dL

## 2021-03-17 LAB — VITAMIN B12: Vitamin B-12: 678 pg/mL (ref 180–914)

## 2021-03-17 LAB — FERRITIN: Ferritin: 486 ng/mL — ABNORMAL HIGH (ref 11–307)

## 2021-03-20 ENCOUNTER — Encounter: Payer: Self-pay | Admitting: Nurse Practitioner

## 2021-03-20 ENCOUNTER — Inpatient Hospital Stay: Payer: Medicare HMO

## 2021-03-20 ENCOUNTER — Other Ambulatory Visit: Payer: Self-pay

## 2021-03-20 ENCOUNTER — Inpatient Hospital Stay: Payer: Medicare HMO | Admitting: Nurse Practitioner

## 2021-03-20 VITALS — BP 120/85 | HR 73 | Temp 98.3°F | Wt 107.6 lb

## 2021-03-20 DIAGNOSIS — D509 Iron deficiency anemia, unspecified: Secondary | ICD-10-CM

## 2021-03-20 DIAGNOSIS — E538 Deficiency of other specified B group vitamins: Secondary | ICD-10-CM

## 2021-03-20 NOTE — Progress Notes (Signed)
[place]  [phone]) [phone] Fax:(336) [phone]  ID: Cindy Waters OB: [birth year]  MR#: [id number]  OZY#:[id number]  Patient Care Team: Kirk Ruths, MD as PCP - General (Internal Medicine) Lloyd Huger, MD as Consulting Physician (Hematology and Oncology)  CHIEF COMPLAINT: Iron deficiency anemia.  INTERVAL HISTORY: Patient returns to clinic today for laboratory work, further evaluation, and consideration of additional IV Venofer.  She currently feels well and is asymptomatic.  She continues oral iron and oral b12. She does not complain of any weakness or fatigue today.  She has no neurologic complaints.  She has a good appetite and denies weight loss.  She denies any recent fevers or illnesses.  She has no chest pain, shortness of breath, cough, or hemoptysis.  She denies any nausea, vomiting, constipation, or diarrhea.  She has no melena or hematochezia.  She has no urinary complaints.  Patient offers no specific complaints today.  REVIEW OF SYSTEMS:   Review of Systems  Constitutional: Negative.  Negative for fever, malaise/fatigue and weight loss.  Respiratory: Negative.  Negative for cough, hemoptysis and shortness of breath.   Cardiovascular: Negative.  Negative for chest pain and leg swelling.  Gastrointestinal: Negative.  Negative for abdominal pain, blood in stool and melena.  Genitourinary: Negative.  Negative for hematuria.  Musculoskeletal: Negative.  Negative for back pain.  Skin: Negative.  Negative for rash.  Neurological: Negative.  Negative for dizziness, focal weakness, weakness and headaches.  Psychiatric/Behavioral: Negative.  The patient is not nervous/anxious.    As per HPI. Otherwise, a complete review of systems is negative.  PAST MEDICAL HISTORY: Past Medical History:  Diagnosis Date   Anemia     PAST SURGICAL HISTORY: No reported surgical history.  FAMILY HISTORY: History reviewed. No pertinent family  history.  ADVANCED DIRECTIVES (Y/N):  N  HEALTH MAINTENANCE: Social History   Tobacco Use   Smoking status: Never    Passive exposure: Never  Substance Use Topics   Alcohol use: Never   Drug use: Never     Colonoscopy:  PAP:  Bone density:  Lipid panel:  No Known Allergies  Current Outpatient Medications  Medication Sig Dispense Refill   traZODone (DESYREL) 50 MG tablet Take by mouth.     acetaminophen (TYLENOL) 500 MG tablet Take 500 mg by mouth every 6 (six) hours as needed.     Calcium Carbonate-Vitamin D 600-200 MG-UNIT TABS Take 1 tablet by mouth daily.     cyanocobalamin (,VITAMIN B-12,) 1000 MCG/ML injection Inject into the muscle.     ferrous sulfate 325 (65 FE) MG tablet Take 325 mg by mouth daily with breakfast.     furosemide (LASIX) 20 MG tablet Take by mouth. (Patient not taking: Reported on 12/15/2020)     isosorbide mononitrate (IMDUR) 30 MG 24 hr tablet Take 1 tablet by mouth one time daily (Patient not taking: Reported on 12/15/2020)     levothyroxine (SYNTHROID) 100 MCG tablet Take by mouth.     metoprolol tartrate (LOPRESSOR) 100 MG tablet Take 1 tablet by mouth 2 (two) times daily.     mirtazapine (REMERON) 7.5 MG tablet Take by mouth.     potassium chloride (KLOR-CON) 8 MEQ tablet TAKE 1 TABLET BY MOUTH ONCE A DAY AS DIRECTED     simvastatin (ZOCOR) 40 MG tablet Take by mouth.     spironolactone (ALDACTONE) 25 MG tablet Take 1 tablet by mouth daily. (Patient not taking: Reported on 12/15/2020)     warfarin (COUMADIN)  2 MG tablet Take 2 mg by mouth daily. ON HOLD AT THIS TIME (Patient not taking: Reported on 09/06/2020)     No current facility-administered medications for this visit.    OBJECTIVE: Vitals:   03/20/21 1349  BP: 120/85  Pulse: 73  Temp: 98.3 F (36.8 C)     Body mass index is 17.37 kg/m.    ECOG FS:0 - Asymptomatic  General: thin build.  Eyes: Pink conjunctiva, anicteric sclera. HEENT: Normocephalic, moist mucous membranes. Lungs:  No audible wheezing or coughing. Heart: Regular rate and rhythm. Abdomen: Soft, nontender, no obvious distention. Musculoskeletal: No edema, cyanosis, or clubbing. Neuro: Alert, answering all questions appropriately. Cranial nerves grossly intact. Skin: No rashes or petechiae noted. Psych: Normal affect.  LAB RESULTS:  Lab Results  Component Value Date   NA 136 08/27/2020   K 3.9 08/27/2020   CL 102 08/27/2020   CO2 24 08/27/2020   GLUCOSE 122 (H) 08/27/2020   BUN 28 (H) 08/27/2020   CREATININE 1.38 (H) 08/27/2020   CALCIUM 9.0 08/27/2020   PROT 7.3 08/27/2020   ALBUMIN 4.1 08/27/2020   AST 40 08/27/2020   ALT 13 08/27/2020   ALKPHOS 58 08/27/2020   BILITOT 0.9 08/27/2020   GFRNONAA 37 (L) 08/27/2020    Lab Results  Component Value Date   WBC 6.1 03/17/2021   NEUTROABS 3.6 03/17/2021   HGB 11.3 (L) 03/17/2021   HCT 35.1 (L) 03/17/2021   MCV 100.9 (H) 03/17/2021   PLT 197 03/17/2021   Lab Results  Component Value Date   IRON 103 03/17/2021   TIBC 301 03/17/2021   IRONPCTSAT 34 (H) 03/17/2021   Lab Results  Component Value Date   FERRITIN 486 (H) 03/17/2021     STUDIES: No results found.  ASSESSMENT:  Iron deficiency anemia.  PLAN:    Iron deficiency anemia: Hemoglobin improved to 11.3. Associated macrocytosis. Ferritin elevated at 486. Iron saturation elevated as well with normal TIBC. No indication for IV iron at this time. Previously, all of her other laboratory work including B12, folate, SPEP, and hemolysis labs are either negative or within normal limits. IntelliGEN Myeloid panel revealed a variant in the [facility] which is seen in approximately 8% of MDS patients.  Patient does not require bone marrow biopsy at this time.  She does not require additional IV Venofer. She last received treatment on September 13, 2020.  Reduce oral iron supplementation to three days a week.   B12 supplementation.  Return to clinic in 3 months with repeat laboratory work, further  evaluation, and consideration of treatment if needed.    Hypertension: Patient's blood pressure is within normal limits today.  Elevated MCV: B12 is normal. Folate is as well. Continue oral b12 supplementation. Possibly secondary to underlying MDS.   Rtc in 3 months for labs  Day to week later- see Woodfin Ganja for follow up- la  Patient expressed understanding and was in agreement with this plan. She also understands that She can call clinic at any time with any questions, concerns, or complaints.    Verlon Au, NP   03/20/2021

## 2021-04-03 DIAGNOSIS — E538 Deficiency of other specified B group vitamins: Secondary | ICD-10-CM | POA: Diagnosis not present

## 2021-04-27 DIAGNOSIS — I25719 Atherosclerosis of autologous vein coronary artery bypass graft(s) with unspecified angina pectoris: Secondary | ICD-10-CM | POA: Diagnosis not present

## 2021-04-27 DIAGNOSIS — I420 Dilated cardiomyopathy: Secondary | ICD-10-CM | POA: Diagnosis not present

## 2021-04-27 DIAGNOSIS — E039 Hypothyroidism, unspecified: Secondary | ICD-10-CM | POA: Diagnosis not present

## 2021-05-04 DIAGNOSIS — E538 Deficiency of other specified B group vitamins: Secondary | ICD-10-CM | POA: Diagnosis not present

## 2021-05-04 DIAGNOSIS — J449 Chronic obstructive pulmonary disease, unspecified: Secondary | ICD-10-CM | POA: Diagnosis not present

## 2021-05-04 DIAGNOSIS — E039 Hypothyroidism, unspecified: Secondary | ICD-10-CM | POA: Diagnosis not present

## 2021-05-04 DIAGNOSIS — E785 Hyperlipidemia, unspecified: Secondary | ICD-10-CM | POA: Diagnosis not present

## 2021-05-04 DIAGNOSIS — I4891 Unspecified atrial fibrillation: Secondary | ICD-10-CM | POA: Diagnosis not present

## 2021-05-04 DIAGNOSIS — I25719 Atherosclerosis of autologous vein coronary artery bypass graft(s) with unspecified angina pectoris: Secondary | ICD-10-CM | POA: Diagnosis not present

## 2021-05-04 DIAGNOSIS — I420 Dilated cardiomyopathy: Secondary | ICD-10-CM | POA: Diagnosis not present

## 2021-05-04 DIAGNOSIS — Z0000 Encounter for general adult medical examination without abnormal findings: Secondary | ICD-10-CM | POA: Diagnosis not present

## 2021-05-04 DIAGNOSIS — Z1389 Encounter for screening for other disorder: Secondary | ICD-10-CM | POA: Diagnosis not present

## 2021-06-05 DIAGNOSIS — E538 Deficiency of other specified B group vitamins: Secondary | ICD-10-CM | POA: Diagnosis not present

## 2021-06-15 ENCOUNTER — Other Ambulatory Visit: Payer: Self-pay

## 2021-06-15 ENCOUNTER — Inpatient Hospital Stay: Payer: Medicare HMO | Attending: Nurse Practitioner

## 2021-06-15 DIAGNOSIS — D509 Iron deficiency anemia, unspecified: Secondary | ICD-10-CM | POA: Diagnosis not present

## 2021-06-15 DIAGNOSIS — E538 Deficiency of other specified B group vitamins: Secondary | ICD-10-CM

## 2021-06-15 LAB — CBC WITH DIFFERENTIAL/PLATELET
Abs Immature Granulocytes: 0.02 10*3/uL (ref 0.00–0.07)
Basophils Absolute: 0.1 10*3/uL (ref 0.0–0.1)
Basophils Relative: 1 %
Eosinophils Absolute: 0.3 10*3/uL (ref 0.0–0.5)
Eosinophils Relative: 3 %
HCT: 34.5 % — ABNORMAL LOW (ref 36.0–46.0)
Hemoglobin: 11.3 g/dL — ABNORMAL LOW (ref 12.0–15.0)
Immature Granulocytes: 0 %
Lymphocytes Relative: 17 %
Lymphs Abs: 1.3 10*3/uL (ref 0.7–4.0)
MCH: 32 pg (ref 26.0–34.0)
MCHC: 32.8 g/dL (ref 30.0–36.0)
MCV: 97.7 fL (ref 80.0–100.0)
Monocytes Absolute: 0.7 10*3/uL (ref 0.1–1.0)
Monocytes Relative: 9 %
Neutro Abs: 5.3 10*3/uL (ref 1.7–7.7)
Neutrophils Relative %: 70 %
Platelets: 230 10*3/uL (ref 150–400)
RBC: 3.53 MIL/uL — ABNORMAL LOW (ref 3.87–5.11)
RDW: 12 % (ref 11.5–15.5)
WBC: 7.7 10*3/uL (ref 4.0–10.5)
nRBC: 0 % (ref 0.0–0.2)

## 2021-06-15 LAB — IRON AND TIBC
Iron: 82 ug/dL (ref 28–170)
Saturation Ratios: 28 % (ref 10.4–31.8)
TIBC: 293 ug/dL (ref 250–450)
UIBC: 211 ug/dL

## 2021-06-15 LAB — VITAMIN B12: Vitamin B-12: 722 pg/mL (ref 180–914)

## 2021-06-15 LAB — FERRITIN: Ferritin: 411 ng/mL — ABNORMAL HIGH (ref 11–307)

## 2021-06-15 LAB — FOLATE: Folate: 8.4 ng/mL (ref >5.9)

## 2021-06-17 NOTE — Progress Notes (Signed)
?[place]  ?Telephone:(336) [phone] [phone] ? ?ID: Cindy Waters OB: July 22, 1930  MR#: [id number]  CSN#:[id number] ? ?Patient Care Team: ?Kirk Ruths, MD as PCP - General (Internal Medicine) ?Lloyd Huger, MD as Consulting Physician (Hematology and Oncology) ? ?CHIEF COMPLAINT: Iron deficiency anemia. ? ?INTERVAL HISTORY: Patient returns to clinic today for repeat laboratory work, further evaluation, and consideration of additional IV Venofer.  She continues to feel well and remains asymptomatic.  She denies any weakness or fatigue.  She has no neurologic complaints.  She has a good appetite and denies weight loss.  She denies any recent fevers or illnesses.  She has no chest pain, shortness of breath, cough, or hemoptysis.  She denies any nausea, vomiting, constipation, or diarrhea.  She has no melena or hematochezia.  She has no urinary complaints.  Patient offers no specific complaints today. ? ?REVIEW OF SYSTEMS:   ?Review of Systems  ?Constitutional: Negative.  Negative for fever, malaise/fatigue and weight loss.  ?Respiratory: Negative.  Negative for cough, hemoptysis and shortness of breath.   ?Cardiovascular: Negative.  Negative for chest pain and leg swelling.  ?Gastrointestinal: Negative.  Negative for abdominal pain, blood in stool and melena.  ?Genitourinary: Negative.  Negative for hematuria.  ?Musculoskeletal: Negative.  Negative for back pain.  ?Skin: Negative.  Negative for rash.  ?Neurological: Negative.  Negative for dizziness, focal weakness, weakness and headaches.  ?Psychiatric/Behavioral: Negative.  The patient is not nervous/anxious.   ? ?As per HPI. Otherwise, a complete review of systems is negative. ? ?PAST MEDICAL HISTORY: ?Past Medical History:  ?Diagnosis Date  ? Anemia   ? ? ?PAST SURGICAL HISTORY: No reported surgical history. ? ?FAMILY HISTORY: ?History reviewed. No pertinent family history. ? ?ADVANCED DIRECTIVES (Y/N):  N ? ?HEALTH  MAINTENANCE: ?Social History  ? ?Tobacco Use  ? Smoking status: Never  ?  Passive exposure: Never  ?Substance Use Topics  ? Alcohol use: Never  ? Drug use: Never  ? ? ? Colonoscopy: ? PAP: ? Bone density: ? Lipid panel: ? ?No Known Allergies ? ?Current Outpatient Medications  ?Medication Sig Dispense Refill  ? acetaminophen (TYLENOL) 500 MG tablet Take 500 mg by mouth every 6 (six) hours as needed.    ? Calcium Carbonate-Vitamin D 600-200 MG-UNIT TABS Take 1 tablet by mouth daily.    ? cyanocobalamin (,VITAMIN B-12,) 1000 MCG/ML injection Inject into the muscle.    ? ferrous sulfate 325 (65 FE) MG tablet Take 325 mg by mouth daily with breakfast.    ? furosemide (LASIX) 20 MG tablet Take by mouth.    ? isosorbide mononitrate (IMDUR) 30 MG 24 hr tablet     ? levothyroxine (SYNTHROID) 100 MCG tablet Take by mouth.    ? metoprolol tartrate (LOPRESSOR) 100 MG tablet Take 1 tablet by mouth 2 (two) times daily.    ? mirtazapine (REMERON) 7.5 MG tablet Take by mouth.    ? potassium chloride (KLOR-CON) 8 MEQ tablet TAKE 1 TABLET BY MOUTH ONCE A DAY AS DIRECTED    ? simvastatin (ZOCOR) 40 MG tablet Take by mouth.    ? spironolactone (ALDACTONE) 25 MG tablet Take 1 tablet by mouth daily.    ? traZODone (DESYREL) 50 MG tablet Take by mouth.    ? warfarin (COUMADIN) 2 MG tablet Take 2 mg by mouth daily. ON HOLD AT THIS TIME (Patient not taking: Reported on 09/06/2020)    ? ?No current facility-administered medications for this visit.  ? ? ?  OBJECTIVE: ?Vitals:  ? 06/20/21 1308  ?BP: (!) 103/54  ?Pulse: 68  ?Resp: 16  ?Temp: 97.8 ?F (36.6 ?C)  ?SpO2: 97%  ?   Body mass index is 17.25 kg/m?Marland Kitchen    ECOG FS:0 - Asymptomatic ? ?General: Well-developed, well-nourished, no acute distress. ?Eyes: Pink conjunctiva, anicteric sclera. ?HEENT: Normocephalic, moist mucous membranes. ?Lungs: No audible wheezing or coughing. ?Heart: Regular rate and rhythm. ?Abdomen: Soft, nontender, no obvious distention. ?Musculoskeletal: No edema, cyanosis, or  clubbing. ?Neuro: Alert, answering all questions appropriately. Cranial nerves grossly intact. ?Skin: No rashes or petechiae noted. ?Psych: Normal affect. ? ?LAB RESULTS: ? ?Lab Results  ?Component Value Date  ? NA 136 08/27/2020  ? K 3.9 08/27/2020  ? CL 102 08/27/2020  ? CO2 24 08/27/2020  ? GLUCOSE 122 (H) 08/27/2020  ? BUN 28 (H) 08/27/2020  ? CREATININE 1.38 (H) 08/27/2020  ? CALCIUM 9.0 08/27/2020  ? PROT 7.3 08/27/2020  ? ALBUMIN 4.1 08/27/2020  ? AST 40 08/27/2020  ? ALT 13 08/27/2020  ? ALKPHOS 58 08/27/2020  ? BILITOT 0.9 08/27/2020  ? GFRNONAA 37 (L) 08/27/2020  ? ? ?Lab Results  ?Component Value Date  ? WBC 7.7 06/15/2021  ? NEUTROABS 5.3 06/15/2021  ? HGB 11.3 (L) 06/15/2021  ? HCT 34.5 (L) 06/15/2021  ? MCV 97.7 06/15/2021  ? PLT 230 06/15/2021  ? ?Lab Results  ?Component Value Date  ? IRON 82 06/15/2021  ? TIBC 293 06/15/2021  ? IRONPCTSAT 28 06/15/2021  ? ?Lab Results  ?Component Value Date  ? FERRITIN 411 (H) 06/15/2021  ? ? ? ?STUDIES: ?No results found. ? ?ASSESSMENT:  Iron deficiency anemia. ? ?PLAN:   ? ?Iron deficiency anemia: Patient's hemoglobin has improved to 11.3 and her iron stores continue to be within normal limits. Previously, all of her other laboratory work including B12, folate, SPEP, and hemolysis labs are either negative or within normal limits. IntelliGEN Myeloid panel revealed a variant in the [facility] which is seen in approximately 8% of MDS patients.  Patient does not require bone marrow biopsy at this time.  She does not require any treatment today.  She last received treatment on September 13, 2020.  Patient has requested laboratory work to be completed at her primary care physician.  Return to clinic in 3 months for further evaluation and consideration of IV Venofer.  If patient does not require treatment at this clinic visit, she likely can be discharged from clinic. ?Hypertension: Patient's blood pressure is within normal limits today. ?Elevated MCV: MCV is within normal limits  today.  Patient states she receives B12 injections from her primary care physician. ? ? ?Patient expressed understanding and was in agreement with this plan. She also understands that She can call clinic at any time with any questions, concerns, or complaints.  ? ? ?Lloyd Huger, MD   06/20/2021 1:36 PM ? ? ? ? ?

## 2021-06-20 ENCOUNTER — Inpatient Hospital Stay: Payer: Medicare HMO | Admitting: Oncology

## 2021-06-20 ENCOUNTER — Encounter: Payer: Self-pay | Admitting: Oncology

## 2021-06-20 VITALS — BP 103/54 | HR 68 | Temp 97.8°F | Resp 16 | Ht 66.0 in | Wt 106.9 lb

## 2021-06-20 DIAGNOSIS — D509 Iron deficiency anemia, unspecified: Secondary | ICD-10-CM

## 2021-07-06 DIAGNOSIS — E538 Deficiency of other specified B group vitamins: Secondary | ICD-10-CM | POA: Diagnosis not present

## 2021-08-07 DIAGNOSIS — E538 Deficiency of other specified B group vitamins: Secondary | ICD-10-CM | POA: Diagnosis not present

## 2021-09-07 DIAGNOSIS — E538 Deficiency of other specified B group vitamins: Secondary | ICD-10-CM | POA: Diagnosis not present

## 2021-09-07 DIAGNOSIS — E039 Hypothyroidism, unspecified: Secondary | ICD-10-CM | POA: Diagnosis not present

## 2021-09-19 MED FILL — Iron Sucrose Inj 20 MG/ML (Fe Equiv): INTRAVENOUS | Qty: 10 | Status: AC

## 2021-09-20 ENCOUNTER — Inpatient Hospital Stay: Payer: Medicare HMO | Attending: Nurse Practitioner | Admitting: Medical Oncology

## 2021-09-20 ENCOUNTER — Inpatient Hospital Stay: Payer: Medicare HMO

## 2021-09-20 ENCOUNTER — Encounter: Payer: Self-pay | Admitting: Medical Oncology

## 2021-09-20 VITALS — BP 122/61 | HR 57 | Temp 98.0°F | Resp 17 | Wt 103.9 lb

## 2021-09-20 DIAGNOSIS — D509 Iron deficiency anemia, unspecified: Secondary | ICD-10-CM

## 2021-09-20 DIAGNOSIS — I10 Essential (primary) hypertension: Secondary | ICD-10-CM | POA: Insufficient documentation

## 2021-09-20 DIAGNOSIS — E538 Deficiency of other specified B group vitamins: Secondary | ICD-10-CM | POA: Diagnosis not present

## 2021-09-20 LAB — CBC
HCT: 35.2 % — ABNORMAL LOW (ref 36.0–46.0)
Hemoglobin: 11.3 g/dL — ABNORMAL LOW (ref 12.0–15.0)
MCH: 31.6 pg (ref 26.0–34.0)
MCHC: 32.1 g/dL (ref 30.0–36.0)
MCV: 98.3 fL (ref 80.0–100.0)
Platelets: 173 10*3/uL (ref 150–400)
RBC: 3.58 MIL/uL — ABNORMAL LOW (ref 3.87–5.11)
RDW: 12.5 % (ref 11.5–15.5)
WBC: 6.3 10*3/uL (ref 4.0–10.5)
nRBC: 0 % (ref 0.0–0.2)

## 2021-09-20 LAB — FERRITIN: Ferritin: 435 ng/mL — ABNORMAL HIGH (ref 11–307)

## 2021-09-20 LAB — IRON AND TIBC
Iron: 88 ug/dL (ref 28–170)
Saturation Ratios: 29 % (ref 10.4–31.8)
TIBC: 305 ug/dL (ref 250–450)
UIBC: 217 ug/dL

## 2021-09-20 LAB — FOLATE: Folate: 9.3 ng/mL (ref >5.9)

## 2021-09-20 LAB — VITAMIN B12: Vitamin B-12: 631 pg/mL (ref 180–914)

## 2021-09-20 NOTE — Progress Notes (Signed)
[place]  [phone] [phone]  ID: Cindy Waters OB: 04-27-1930  MR#: [id number]  FWY#:[id number]  Patient Care Team: Kirk Ruths, MD as PCP - General (Internal Medicine) Lloyd Huger, MD as Consulting Physician (Hematology and Oncology)  CHIEF COMPLAINT: Iron deficiency anemia.  INTERVAL HISTORY: Patient returns to clinic today for follow up for her IDA and consideration of IV Venofer. She reports that she gets labs completed monthly at her PCP. She continues to feel well and remains asymptomatic.  She denies any weakness or fatigue.  She has no neurologic complaints.  She has a good appetite and denies weight loss.  She denies any recent fevers or illnesses.  She has no chest pain, shortness of breath, cough, or hemoptysis.  She denies any nausea, vomiting, constipation, or diarrhea.  She has no melena or hematochezia.  She has no urinary complaints.  Patient offers no specific complaints today.  REVIEW OF SYSTEMS:   Review of Systems  Constitutional: Negative.  Negative for fever, malaise/fatigue and weight loss.  Respiratory: Negative.  Negative for cough, hemoptysis and shortness of breath.   Cardiovascular: Negative.  Negative for chest pain and leg swelling.  Gastrointestinal: Negative.  Negative for abdominal pain, blood in stool and melena.  Genitourinary: Negative.  Negative for hematuria.  Musculoskeletal: Negative.  Negative for back pain.  Skin: Negative.  Negative for rash.  Neurological: Negative.  Negative for dizziness, focal weakness, weakness and headaches.  Psychiatric/Behavioral: Negative.  The patient is not nervous/anxious.     As per HPI. Otherwise, a complete review of systems is negative.  PAST MEDICAL HISTORY: Past Medical History:  Diagnosis Date   Anemia     PAST SURGICAL HISTORY: No reported surgical history.  FAMILY HISTORY: History reviewed. No pertinent family history.  ADVANCED  DIRECTIVES (Y/N):  N  HEALTH MAINTENANCE: Social History   Tobacco Use   Smoking status: Never    Passive exposure: Never  Substance Use Topics   Alcohol use: Never   Drug use: Never     Colonoscopy:  PAP:  Bone density:  Lipid panel:  No Known Allergies  Current Outpatient Medications  Medication Sig Dispense Refill   acetaminophen (TYLENOL) 500 MG tablet Take 500 mg by mouth every 6 (six) hours as needed.     aspirin EC 81 MG tablet Take 81 mg by mouth daily. Swallow whole.     Calcium Carbonate-Vitamin D 600-200 MG-UNIT TABS Take 1 tablet by mouth daily.     cyanocobalamin (,VITAMIN B-12,) 1000 MCG/ML injection Inject into the muscle.     ferrous sulfate 325 (65 FE) MG tablet Take 325 mg by mouth daily with breakfast.     furosemide (LASIX) 20 MG tablet Take by mouth.     isosorbide mononitrate (IMDUR) 30 MG 24 hr tablet      levothyroxine (SYNTHROID) 100 MCG tablet Take by mouth.     metoprolol tartrate (LOPRESSOR) 100 MG tablet Take 1 tablet by mouth 2 (two) times daily.     mirtazapine (REMERON) 7.5 MG tablet Take by mouth.     potassium chloride (KLOR-CON) 8 MEQ tablet TAKE 1 TABLET BY MOUTH ONCE A DAY AS DIRECTED     simvastatin (ZOCOR) 40 MG tablet Take by mouth.     spironolactone (ALDACTONE) 25 MG tablet Take 1 tablet by mouth daily.     traZODone (DESYREL) 50 MG tablet Take by mouth.     No current facility-administered medications for this visit.  OBJECTIVE: Vitals:   09/20/21 1313  BP: 122/61  Pulse: (!) 57  Resp: 17  Temp: 98 F (36.7 C)  SpO2: 97%     Body mass index is 16.77 kg/m.    ECOG FS:0 - Asymptomatic  General: Well-developed, well-nourished, no acute distress. Eyes: Pink conjunctiva, anicteric sclera. HEENT: Normocephalic, moist mucous membranes. Lungs: No audible wheezing or coughing. Heart: Regular rate and rhythm. Abdomen: Soft, nontender, no obvious distention. Musculoskeletal: No edema, cyanosis, or clubbing. Neuro: Alert,  answering all questions appropriately. Cranial nerves grossly intact. Skin: No rashes or petechiae noted. Psych: Normal affect.  LAB RESULTS:  Lab Results  Component Value Date   NA 136 08/27/2020   K 3.9 08/27/2020   CL 102 08/27/2020   CO2 24 08/27/2020   GLUCOSE 122 (H) 08/27/2020   BUN 28 (H) 08/27/2020   CREATININE 1.38 (H) 08/27/2020   CALCIUM 9.0 08/27/2020   PROT 7.3 08/27/2020   ALBUMIN 4.1 08/27/2020   AST 40 08/27/2020   ALT 13 08/27/2020   ALKPHOS 58 08/27/2020   BILITOT 0.9 08/27/2020   GFRNONAA 37 (L) 08/27/2020    Lab Results  Component Value Date   WBC 7.7 06/15/2021   NEUTROABS 5.3 06/15/2021   HGB 11.3 (L) 06/15/2021   HCT 34.5 (L) 06/15/2021   MCV 97.7 06/15/2021   PLT 230 06/15/2021   Lab Results  Component Value Date   IRON 82 06/15/2021   TIBC 293 06/15/2021   IRONPCTSAT 28 06/15/2021   Lab Results  Component Value Date   FERRITIN 411 (H) 06/15/2021     STUDIES: No results found.  ASSESSMENT:  Iron deficiency anemia.  PLAN:    Iron deficiency anemia: Patient's hemoglobin has improved to 11.3 and her iron stores continue to be within normal limits. Previously, all of her other laboratory work including B12, folate, SPEP, and hemolysis labs are either negative or within normal limits. [facility] Myeloid panel revealed a variant in the [facility] which is seen in approximately 8% of MDS patients.  Patient does not require bone marrow biopsy at this time.  After trying to find her blood work results it was discovered that patient was not getting blood work completed but rather was only having a B12 injection monthly. I was able to educate patient that blood work has to be drawn separately and does not occur with injections. We will get her UTD on blood work today. As long as labs are stable she will be discharged from our clinic and be monitored by PCP.  Hypertension: Patient's blood pressure is within normal limits today. Elevated MCV: Chronic.  Stable. MCV is within normal limits today.  Patient states she receives B12 injections from her primary care physician.   Patient expressed understanding and was in agreement with this plan. She also understands that She can call clinic at any time with any questions, concerns, or complaints.    Hughie Closs, PA-C   09/20/2021 1:42 PM

## 2021-09-20 NOTE — Progress Notes (Signed)
Feeling well. States she is feeling fairly well. Dyspnea with exertion. No visible blood anywhere. Takes oral iron. Denies fatigue. No pain. Appetite is fair. Has trouble with diarrhea for the past year treated by her family doctor.

## 2021-10-10 DIAGNOSIS — E538 Deficiency of other specified B group vitamins: Secondary | ICD-10-CM | POA: Diagnosis not present

## 2021-11-06 DIAGNOSIS — J449 Chronic obstructive pulmonary disease, unspecified: Secondary | ICD-10-CM | POA: Diagnosis not present

## 2021-11-06 DIAGNOSIS — N1832 Chronic kidney disease, stage 3b: Secondary | ICD-10-CM | POA: Diagnosis not present

## 2021-11-06 DIAGNOSIS — E538 Deficiency of other specified B group vitamins: Secondary | ICD-10-CM | POA: Diagnosis not present

## 2021-11-06 DIAGNOSIS — R7309 Other abnormal glucose: Secondary | ICD-10-CM | POA: Diagnosis not present

## 2021-11-06 DIAGNOSIS — I420 Dilated cardiomyopathy: Secondary | ICD-10-CM | POA: Diagnosis not present

## 2021-11-06 DIAGNOSIS — I25719 Atherosclerosis of autologous vein coronary artery bypass graft(s) with unspecified angina pectoris: Secondary | ICD-10-CM | POA: Diagnosis not present

## 2021-11-06 DIAGNOSIS — E039 Hypothyroidism, unspecified: Secondary | ICD-10-CM | POA: Diagnosis not present

## 2021-11-06 DIAGNOSIS — Z23 Encounter for immunization: Secondary | ICD-10-CM | POA: Diagnosis not present

## 2021-11-06 DIAGNOSIS — I4891 Unspecified atrial fibrillation: Secondary | ICD-10-CM | POA: Diagnosis not present

## 2021-11-06 DIAGNOSIS — D469 Myelodysplastic syndrome, unspecified: Secondary | ICD-10-CM | POA: Diagnosis not present

## 2021-11-10 DIAGNOSIS — E538 Deficiency of other specified B group vitamins: Secondary | ICD-10-CM | POA: Diagnosis not present

## 2021-12-12 DIAGNOSIS — E538 Deficiency of other specified B group vitamins: Secondary | ICD-10-CM | POA: Diagnosis not present

## 2021-12-25 DIAGNOSIS — R531 Weakness: Secondary | ICD-10-CM | POA: Diagnosis not present

## 2021-12-25 DIAGNOSIS — R404 Transient alteration of awareness: Secondary | ICD-10-CM | POA: Diagnosis not present

## 2021-12-25 DIAGNOSIS — R197 Diarrhea, unspecified: Secondary | ICD-10-CM | POA: Diagnosis not present

## 2022-01-01 DIAGNOSIS — R531 Weakness: Secondary | ICD-10-CM | POA: Diagnosis not present

## 2022-01-12 DIAGNOSIS — E538 Deficiency of other specified B group vitamins: Secondary | ICD-10-CM | POA: Diagnosis not present

## 2022-02-15 DIAGNOSIS — N1832 Chronic kidney disease, stage 3b: Secondary | ICD-10-CM | POA: Diagnosis not present

## 2022-02-15 DIAGNOSIS — E538 Deficiency of other specified B group vitamins: Secondary | ICD-10-CM | POA: Diagnosis not present

## 2022-02-15 DIAGNOSIS — I25719 Atherosclerosis of autologous vein coronary artery bypass graft(s) with unspecified angina pectoris: Secondary | ICD-10-CM | POA: Diagnosis not present

## 2022-02-15 DIAGNOSIS — E039 Hypothyroidism, unspecified: Secondary | ICD-10-CM | POA: Diagnosis not present

## 2022-03-19 DIAGNOSIS — E538 Deficiency of other specified B group vitamins: Secondary | ICD-10-CM | POA: Diagnosis not present

## 2022-04-19 DIAGNOSIS — R7309 Other abnormal glucose: Secondary | ICD-10-CM | POA: Diagnosis not present

## 2022-04-19 DIAGNOSIS — I25719 Atherosclerosis of autologous vein coronary artery bypass graft(s) with unspecified angina pectoris: Secondary | ICD-10-CM | POA: Diagnosis not present

## 2022-04-19 DIAGNOSIS — E538 Deficiency of other specified B group vitamins: Secondary | ICD-10-CM | POA: Diagnosis not present

## 2022-04-19 DIAGNOSIS — N1832 Chronic kidney disease, stage 3b: Secondary | ICD-10-CM | POA: Diagnosis not present

## 2022-04-19 DIAGNOSIS — E039 Hypothyroidism, unspecified: Secondary | ICD-10-CM | POA: Diagnosis not present

## 2022-05-08 DIAGNOSIS — I420 Dilated cardiomyopathy: Secondary | ICD-10-CM | POA: Diagnosis not present

## 2022-05-08 DIAGNOSIS — I4891 Unspecified atrial fibrillation: Secondary | ICD-10-CM | POA: Diagnosis not present

## 2022-05-08 DIAGNOSIS — Z1331 Encounter for screening for depression: Secondary | ICD-10-CM | POA: Diagnosis not present

## 2022-05-08 DIAGNOSIS — I25719 Atherosclerosis of autologous vein coronary artery bypass graft(s) with unspecified angina pectoris: Secondary | ICD-10-CM | POA: Diagnosis not present

## 2022-05-08 DIAGNOSIS — E785 Hyperlipidemia, unspecified: Secondary | ICD-10-CM | POA: Diagnosis not present

## 2022-05-08 DIAGNOSIS — J4489 Other specified chronic obstructive pulmonary disease: Secondary | ICD-10-CM | POA: Diagnosis not present

## 2022-05-08 DIAGNOSIS — E039 Hypothyroidism, unspecified: Secondary | ICD-10-CM | POA: Diagnosis not present

## 2022-05-08 DIAGNOSIS — Z0000 Encounter for general adult medical examination without abnormal findings: Secondary | ICD-10-CM | POA: Diagnosis not present

## 2022-05-08 DIAGNOSIS — E538 Deficiency of other specified B group vitamins: Secondary | ICD-10-CM | POA: Diagnosis not present

## 2022-05-21 DIAGNOSIS — E538 Deficiency of other specified B group vitamins: Secondary | ICD-10-CM | POA: Diagnosis not present

## 2022-06-21 DIAGNOSIS — E538 Deficiency of other specified B group vitamins: Secondary | ICD-10-CM | POA: Diagnosis not present

## 2022-07-23 DIAGNOSIS — E538 Deficiency of other specified B group vitamins: Secondary | ICD-10-CM | POA: Diagnosis not present

## 2022-08-23 DIAGNOSIS — E538 Deficiency of other specified B group vitamins: Secondary | ICD-10-CM | POA: Diagnosis not present

## 2022-09-25 DIAGNOSIS — E538 Deficiency of other specified B group vitamins: Secondary | ICD-10-CM | POA: Diagnosis not present

## 2022-10-26 DIAGNOSIS — E538 Deficiency of other specified B group vitamins: Secondary | ICD-10-CM | POA: Diagnosis not present

## 2022-11-13 DIAGNOSIS — E538 Deficiency of other specified B group vitamins: Secondary | ICD-10-CM | POA: Diagnosis not present

## 2022-11-13 DIAGNOSIS — J42 Unspecified chronic bronchitis: Secondary | ICD-10-CM | POA: Diagnosis not present

## 2022-11-13 DIAGNOSIS — R7303 Prediabetes: Secondary | ICD-10-CM | POA: Diagnosis not present

## 2022-11-13 DIAGNOSIS — I25719 Atherosclerosis of autologous vein coronary artery bypass graft(s) with unspecified angina pectoris: Secondary | ICD-10-CM | POA: Diagnosis not present

## 2022-11-13 DIAGNOSIS — I4891 Unspecified atrial fibrillation: Secondary | ICD-10-CM | POA: Diagnosis not present

## 2022-11-20 DIAGNOSIS — I420 Dilated cardiomyopathy: Secondary | ICD-10-CM | POA: Diagnosis not present

## 2022-11-20 DIAGNOSIS — I25719 Atherosclerosis of autologous vein coronary artery bypass graft(s) with unspecified angina pectoris: Secondary | ICD-10-CM | POA: Diagnosis not present

## 2022-11-20 DIAGNOSIS — E538 Deficiency of other specified B group vitamins: Secondary | ICD-10-CM | POA: Diagnosis not present

## 2022-11-20 DIAGNOSIS — N1832 Chronic kidney disease, stage 3b: Secondary | ICD-10-CM | POA: Diagnosis not present

## 2022-11-20 DIAGNOSIS — R7303 Prediabetes: Secondary | ICD-10-CM | POA: Diagnosis not present

## 2022-11-20 DIAGNOSIS — Z23 Encounter for immunization: Secondary | ICD-10-CM | POA: Diagnosis not present

## 2022-11-20 DIAGNOSIS — I4891 Unspecified atrial fibrillation: Secondary | ICD-10-CM | POA: Diagnosis not present

## 2022-11-20 DIAGNOSIS — J4489 Other specified chronic obstructive pulmonary disease: Secondary | ICD-10-CM | POA: Diagnosis not present

## 2022-11-20 DIAGNOSIS — E039 Hypothyroidism, unspecified: Secondary | ICD-10-CM | POA: Diagnosis not present

## 2022-11-26 DIAGNOSIS — E538 Deficiency of other specified B group vitamins: Secondary | ICD-10-CM | POA: Diagnosis not present

## 2022-12-27 DIAGNOSIS — E538 Deficiency of other specified B group vitamins: Secondary | ICD-10-CM | POA: Diagnosis not present

## 2023-01-28 DIAGNOSIS — E538 Deficiency of other specified B group vitamins: Secondary | ICD-10-CM | POA: Diagnosis not present

## 2023-10-14 DEATH — deceased
